# Patient Record
Sex: Male | Born: 1985
Health system: Southern US, Community
[De-identification: ages and names within clinical notes are randomized; demographics above are authoritative.]

## PROBLEM LIST (undated history)

## (undated) DIAGNOSIS — E119 Type 2 diabetes mellitus without complications: Secondary | ICD-10-CM

## (undated) DIAGNOSIS — I10 Essential (primary) hypertension: Secondary | ICD-10-CM

---

## 2016-07-08 ENCOUNTER — Encounter (HOSPITAL_COMMUNITY): Payer: Self-pay

## 2016-07-08 ENCOUNTER — Emergency Department (HOSPITAL_COMMUNITY)
Admission: EM | Admit: 2016-07-08 | Discharge: 2016-07-08 | Disposition: A | Discharge status: Home / Self Care | Payer: No Typology Code available for payment source | Attending: Emergency Medicine | Admitting: Emergency Medicine

## 2016-07-08 ENCOUNTER — Emergency Department (HOSPITAL_COMMUNITY): Payer: No Typology Code available for payment source

## 2016-07-08 DIAGNOSIS — I10 Essential (primary) hypertension: Secondary | ICD-10-CM | POA: Insufficient documentation

## 2016-07-08 DIAGNOSIS — Y939 Activity, unspecified: Secondary | ICD-10-CM | POA: Insufficient documentation

## 2016-07-08 DIAGNOSIS — S3992XA Unspecified injury of lower back, initial encounter: Secondary | ICD-10-CM | POA: Diagnosis present

## 2016-07-08 DIAGNOSIS — Y999 Unspecified external cause status: Secondary | ICD-10-CM | POA: Insufficient documentation

## 2016-07-08 DIAGNOSIS — E119 Type 2 diabetes mellitus without complications: Secondary | ICD-10-CM | POA: Diagnosis not present

## 2016-07-08 DIAGNOSIS — Y92410 Unspecified street and highway as the place of occurrence of the external cause: Secondary | ICD-10-CM | POA: Insufficient documentation

## 2016-07-08 DIAGNOSIS — S20219A Contusion of unspecified front wall of thorax, initial encounter: Secondary | ICD-10-CM | POA: Diagnosis not present

## 2016-07-08 DIAGNOSIS — S39012A Strain of muscle, fascia and tendon of lower back, initial encounter: Secondary | ICD-10-CM

## 2016-07-08 DIAGNOSIS — F17210 Nicotine dependence, cigarettes, uncomplicated: Secondary | ICD-10-CM | POA: Diagnosis not present

## 2016-07-08 HISTORY — DX: Type 2 diabetes mellitus without complications: E11.9

## 2016-07-08 HISTORY — DX: Essential (primary) hypertension: I10

## 2016-07-08 MED ORDER — IBUPROFEN 600 MG PO TABS: 600.0000 mg | ORAL_TABLET | Freq: Four times a day (QID) | ORAL | 0 refills | Status: DC | PRN

## 2016-07-08 MED ORDER — IBUPROFEN 800 MG PO TABS
800.0000 mg | ORAL_TABLET | Freq: Once | ORAL | Status: AC
  Administered 2016-07-08: 800 mg via ORAL
  Filled 2016-07-08: qty 1

## 2016-07-08 MED ORDER — CYCLOBENZAPRINE HCL 10 MG PO TABS: 10.0000 mg | ORAL_TABLET | Freq: Two times a day (BID) | ORAL | 0 refills | Status: DC | PRN
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
## 2016-07-08 NOTE — ED Notes (Signed)
Patient transported to X-ray 
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
## 2016-07-11 ENCOUNTER — Encounter (HOSPITAL_COMMUNITY): Payer: Self-pay | Admitting: Emergency Medicine

## 2016-07-11 ENCOUNTER — Emergency Department (HOSPITAL_COMMUNITY)
Admission: EM | Admit: 2016-07-11 | Discharge: 2016-07-11 | Disposition: A | Discharge status: Home / Self Care | Payer: No Typology Code available for payment source | Attending: Emergency Medicine | Admitting: Emergency Medicine

## 2016-07-11 DIAGNOSIS — F17210 Nicotine dependence, cigarettes, uncomplicated: Secondary | ICD-10-CM | POA: Insufficient documentation

## 2016-07-11 DIAGNOSIS — M545 Low back pain, unspecified: Secondary | ICD-10-CM

## 2016-07-11 DIAGNOSIS — Z79899 Other long term (current) drug therapy: Secondary | ICD-10-CM | POA: Diagnosis not present

## 2016-07-11 DIAGNOSIS — Y92410 Unspecified street and highway as the place of occurrence of the external cause: Secondary | ICD-10-CM | POA: Insufficient documentation

## 2016-07-11 DIAGNOSIS — Y999 Unspecified external cause status: Secondary | ICD-10-CM | POA: Insufficient documentation

## 2016-07-11 DIAGNOSIS — E119 Type 2 diabetes mellitus without complications: Secondary | ICD-10-CM | POA: Insufficient documentation

## 2016-07-11 DIAGNOSIS — I10 Essential (primary) hypertension: Secondary | ICD-10-CM | POA: Insufficient documentation

## 2016-07-11 DIAGNOSIS — Y939 Activity, unspecified: Secondary | ICD-10-CM | POA: Insufficient documentation

## 2016-07-11 MED ORDER — ACETAMINOPHEN 500 MG PO TABS: 1000.0000 mg | ORAL_TABLET | Freq: Four times a day (QID) | ORAL | 0 refills | Status: DC | PRN

## 2016-07-11 MED ORDER — ACETAMINOPHEN 500 MG PO TABS
1000.0000 mg | ORAL_TABLET | Freq: Once | ORAL | Status: AC
  Administered 2016-07-11: 1000 mg via ORAL
  Filled 2016-07-11: qty 2
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
## 2016-07-11 NOTE — Discharge Instructions (Signed)
Continue taking Ibuprofen and Flexeril.  You may take 1000 mg Tylenol every 6 hours.  Do not exceed 4 grams in 24 hours as this can cause liver damage.  You may also apply heat to help with the muscle spasms.  Try the attached exercises to help with lower back pain.  [facility].
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
## 2017-11-26 ENCOUNTER — Encounter (HOSPITAL_COMMUNITY): Payer: Self-pay | Admitting: Emergency Medicine

## 2017-11-26 ENCOUNTER — Emergency Department (HOSPITAL_COMMUNITY)
Admission: EM | Admit: 2017-11-26 | Discharge: 2017-11-26 | Disposition: A | Discharge status: Home / Self Care | Payer: BLUE CROSS/BLUE SHIELD | Attending: Emergency Medicine | Admitting: Emergency Medicine

## 2017-11-26 DIAGNOSIS — E119 Type 2 diabetes mellitus without complications: Secondary | ICD-10-CM | POA: Diagnosis not present

## 2017-11-26 DIAGNOSIS — I10 Essential (primary) hypertension: Secondary | ICD-10-CM | POA: Insufficient documentation

## 2017-11-26 DIAGNOSIS — F17210 Nicotine dependence, cigarettes, uncomplicated: Secondary | ICD-10-CM | POA: Diagnosis not present

## 2017-11-26 DIAGNOSIS — Z711 Person with feared health complaint in whom no diagnosis is made: Secondary | ICD-10-CM

## 2017-11-26 DIAGNOSIS — Z202 Contact with and (suspected) exposure to infections with a predominantly sexual mode of transmission: Secondary | ICD-10-CM | POA: Diagnosis present

## 2017-11-26 MED ORDER — CEFTRIAXONE SODIUM 250 MG IJ SOLR
250.0000 mg | Freq: Once | INTRAMUSCULAR | Status: AC
  Administered 2017-11-26: 250 mg via INTRAMUSCULAR
  Filled 2017-11-26: qty 250

## 2017-11-26 MED ORDER — LIDOCAINE HCL 1 % IJ SOLN
INTRAMUSCULAR | Status: AC
  Administered 2017-11-26: 0.9 mL
  Filled 2017-11-26: qty 20

## 2017-11-26 MED ORDER — AZITHROMYCIN 250 MG PO TABS
1000.0000 mg | ORAL_TABLET | Freq: Once | ORAL | Status: AC
  Administered 2017-11-26: 1000 mg via ORAL
  Filled 2017-11-26: qty 4

## 2017-11-26 NOTE — ED Notes (Signed)
Bed: WTR7 Expected date:  Expected time:  Means of arrival:  Comments: 

## 2017-11-26 NOTE — ED Triage Notes (Signed)
Patient reports that he got tested for STDs and GC chlamydia was inconclusive and his male partner got tested and she was positive for chlamydia, so he needs to be treated.

## 2017-11-26 NOTE — ED Notes (Signed)
Bed: [facility]1 Expected date:  Expected time:  Means of arrival:  Comments: 

## 2017-11-26 NOTE — ED Notes (Signed)
Bed: WTR9 Expected date:  Expected time:  Means of arrival:  Comments: 

## 2017-11-26 NOTE — ED Provider Notes (Signed)
[facility]-EMERGENCY DEPT Provider Note   CSN: [id number] Arrival date & time: 11/26/17  1113     History   Chief Complaint Chief Complaint  Patient presents with  . Exposure to STD    HPI Jaime Henson is a 32 y.o. male with a PMHx of DM2 and HTN, who presents to the ED for treatment of STDs.  Patient states that he was tested at the health department last week and his tests were "inconclusive" however his girlfriend tested positive for gonorrhea.  He is requesting treatment today.  He denies any testicular pain or swelling, penile discharge, dysuria, hematuria, abdominal pain, or any other complaints at this time.  No known aggravating or alleviating factors for his issue today. NKDA.    The history is provided by the patient and medical records. No language interpreter was used.  Exposure to STD  This is a new problem. The current episode started more than 1 week ago. The problem occurs constantly. The problem has not changed since onset.Pertinent negatives include no abdominal pain. Nothing aggravates the symptoms. Nothing relieves the symptoms. He has tried nothing for the symptoms. The treatment provided no relief.    Past Medical History:  Diagnosis Date  . Diabetes mellitus without complication (HCC)   . Hypertension   . Type 2 diabetes mellitus (HCC)     There are no active problems to display for this patient.   History reviewed. No pertinent surgical history.     Home Medications    Prior to Admission medications   Medication Sig Start Date End Date Taking? Authorizing Provider  acetaminophen (TYLENOL) 500 MG tablet Take 2 tablets (1,000 mg total) by mouth every 6 (six) hours as needed. 07/11/16   Cheri Fowler, PA-C  cyclobenzaprine (FLEXERIL) 10 MG tablet Take 1 tablet (10 mg total) by mouth 2 (two) times daily as needed for muscle spasms. 07/08/16   Rolan Bucco, MD  ibuprofen (ADVIL,MOTRIN) 600 MG tablet Take 1 tablet (600 mg total) by  mouth every 6 (six) hours as needed. 07/08/16   Rolan Bucco, MD    Family History No family history on file.  Social History Social History   Tobacco Use  . Smoking status: Current Some Day Smoker    Types: Cigarettes  . Smokeless tobacco: Never Used  Substance Use Topics  . Alcohol use: Yes  . Drug use: No     Allergies   Shellfish allergy   Review of Systems Review of Systems  Gastrointestinal: Negative for abdominal pain.  Genitourinary: Negative for discharge, dysuria, hematuria, scrotal swelling and testicular pain.  Allergic/Immunologic: Positive for immunocompromised state (DM2).     Physical Exam Updated Vital Signs BP (!) 151/110   Pulse 73   Temp 98.5 F (36.9 C) (Oral)   Resp 15   SpO2 100%   Physical Exam  Constitutional: He is oriented to person, place, and time. Vital signs are normal. He appears well-developed and well-nourished.  Non-toxic appearance. No distress.  Afebrile, nontoxic, NAD  HENT:  Head: Normocephalic and atraumatic.  Mouth/Throat: Oropharynx is clear and moist and mucous membranes are normal.  Eyes: Conjunctivae and EOM are normal. Right eye exhibits no discharge. Left eye exhibits no discharge.  Neck: Normal range of motion. Neck supple.  Cardiovascular: Normal rate, regular rhythm, normal heart sounds and intact distal pulses. Exam reveals no gallop and no friction rub.  No murmur heard. Pulmonary/Chest: Effort normal and breath sounds normal. No respiratory distress. He has no decreased breath  sounds. He has no wheezes. He has no rhonchi. He has no rales.  Abdominal: Soft. Normal appearance and bowel sounds are normal. He exhibits no distension. There is no tenderness. There is no rigidity, no rebound, no guarding, no CVA tenderness, no tenderness at McBurney's point and negative Murphy's sign.  Soft, NTND, +BS throughout, no r/g/r, neg murphy's, neg mcburney's, no CVA TTP   Genitourinary:  Genitourinary Comments: Deferred    Musculoskeletal: Normal range of motion.  Neurological: He is alert and oriented to person, place, and time. He has normal strength. No sensory deficit.  Skin: Skin is warm, dry and intact. No rash noted.  Psychiatric: He has a normal mood and affect.  Nursing note and vitals reviewed.    ED Treatments / Results  Labs (all labs ordered are listed, but only abnormal results are displayed) Labs Reviewed - No data to display  EKG  EKG Interpretation None       Radiology No results found.  Procedures Procedures (including critical care time)  Medications Ordered in ED Medications  azithromycin (ZITHROMAX) tablet 1,000 mg (not administered)    And  cefTRIAXone (ROCEPHIN) injection 250 mg (not administered)     Initial Impression / Assessment and Plan / ED Course  I have reviewed the triage vital signs and the nursing notes.  Pertinent labs & imaging results that were available during my care of the patient were reviewed by me and considered in my medical decision making (see chart for details).     32 y.o. male here requesting treatment for gonorrhea. He was tested and his test was "inconclusive" and his girlfriend tested positive so he wants treatment. Pt with no symptoms. No abdominal tenderness on exam. Deferred GU exam given lack of symptoms. Will treat for GC/CT empirically. Discussed abstinence x10 days. F/up with health dept for future STD concerns. Safe sex encouraged, and discussed having partners tested and treated before re-engaging in intercourse after the 10 day abstinence period.  I explained the diagnosis and have given explicit precautions to return to the ER including for any other new or worsening symptoms. The patient understands and accepts the medical plan as it's been dictated and I have answered their questions. Discharge instructions concerning home care and prescriptions have been given. The patient is STABLE and is discharged to home in good condition.     Final Clinical Impressions(s) / ED Diagnoses   Final diagnoses:  Concern about STD in male without diagnosis  Exposure to STD    ED Discharge Orders    [address]None       Brant Peets, PlatterMercedes, New JerseyPA-C 11/26/17 1322    Zadie RhineWickline, [place], MD 11/26/17 1558

## 2017-11-26 NOTE — Discharge Instructions (Signed)
You have been treated for gonorrhea and chlamydia in the ER but the hospital. NO SEXUAL INTERCOURSE FOR AT LEAST 10 DAYS AFTER TODAY'S VISIT, THIS WILL INVALIDATE YOUR TREATMENT HERE. ALL PARTNERS MUST BE TESTED AND TREATED FOR STD'S. ALWAYS USE CONDOMS WHEN ENGAGING IN INTERCOURSE. [facility]D concerns or screenings. This is the recommendation by the CDC for people with multiple sexual partners or history of STDs.

## 2017-12-28 ENCOUNTER — Emergency Department (HOSPITAL_COMMUNITY)
Admission: EM | Admit: 2017-12-28 | Discharge: 2017-12-28 | Disposition: A | Discharge status: Home / Self Care | Payer: BLUE CROSS/BLUE SHIELD | Attending: Physician Assistant | Admitting: Physician Assistant

## 2017-12-28 ENCOUNTER — Encounter (HOSPITAL_COMMUNITY): Payer: Self-pay | Admitting: Emergency Medicine

## 2017-12-28 ENCOUNTER — Emergency Department (HOSPITAL_COMMUNITY): Payer: BLUE CROSS/BLUE SHIELD

## 2017-12-28 ENCOUNTER — Other Ambulatory Visit: Payer: Self-pay

## 2017-12-28 DIAGNOSIS — E119 Type 2 diabetes mellitus without complications: Secondary | ICD-10-CM | POA: Diagnosis not present

## 2017-12-28 DIAGNOSIS — I10 Essential (primary) hypertension: Secondary | ICD-10-CM | POA: Diagnosis not present

## 2017-12-28 DIAGNOSIS — M25511 Pain in right shoulder: Secondary | ICD-10-CM

## 2017-12-28 DIAGNOSIS — F17210 Nicotine dependence, cigarettes, uncomplicated: Secondary | ICD-10-CM | POA: Diagnosis not present

## 2017-12-28 MED ORDER — MELOXICAM 15 MG PO TABS: 15.0000 mg | ORAL_TABLET | Freq: Every day | ORAL | 0 refills | Status: AC

## 2017-12-28 NOTE — Discharge Instructions (Addendum)
It was my pleasure taking care of you today!  Please take Mobic once daily for 7 days.  Ice shoulder throughout the day (instructions below).  Wear shoulder sling for no more than 3 days, then begin performing gentle range of motion exercises.   Call the orthopedist listed today or tomorrow to schedule a follow up appointment for recheck of ongoing shoulder pain in 1-2 weeks that can be canceled with a 24-48 hour notice if complete resolution of pain.    Call the orthopedist listed if symptoms are not improved in one week.   Return to the ER for new or worsening symptoms, any additional concerns.  COLD THERAPY DIRECTIONS:  Ice or gel packs can be used to reduce both pain and swelling. Ice is the most helpful within the first 24 to 48 hours after an injury or flareup from overusing a muscle or joint.  Ice is effective, has very few side effects, and is safe for most people to use.   If you expose your skin to cold temperatures for too long or without the proper protection, you can damage your skin or nerves. Watch for signs of skin damage due to cold.   HOME CARE INSTRUCTIONS  Follow these tips to use ice and cold packs safely.  Place a dry or damp towel between the ice and skin. A damp towel will cool the skin more quickly, so you may need to shorten the time that the ice is used.  For a more rapid response, add gentle compression to the ice.  Ice for no more than 10 to 20 minutes at a time. The bonier the area you are icing, the less time it will take to get the benefits of ice.  Check your skin after 5 minutes to make sure there are no signs of a poor response to cold or skin damage.  Rest 20 minutes or more in between uses.  Once your skin is numb, you can end your treatment. You can test numbness by very lightly touching your skin. The touch should be so light that you do not see the skin dimple from the pressure of your fingertip. When using ice, most people will feel these normal  sensations in this order: cold, burning, aching, and numbness.    Please return the emergency department if you notice any redness, increased swelling, loss of sensation, weakness, or abnormal color to right upper extremity.  To find a primary care provider: To find a primary care or specialty doctor please call [phone] or [phone] to access "[facility] Find a Doctor Service."  You may also go on the [facility] website at [email or web address]  There are also multiple [facility] and [facility] practices throughout the [place] that are frequently accepting new patients. You may find a clinic that is close to your home and contact them.  [facility] - [address] [place] [phone]  [facility] in [place] (also locations in [place] and [place]) - [address] [place] [phone]  [facility] - [address] [place] [phone]

## 2017-12-28 NOTE — ED Provider Notes (Signed)
[facility]-EMERGENCY DEPT Provider Note   CSN: [id number] Arrival date & time: 12/28/17  1209     History   Chief Complaint Chief Complaint  Patient presents with  . Shoulder Pain    HPI Jaime Henson is a 32 y.o. male.  HPI   Patient is a 32 year old male with a history of diabetes mellitus, type II (previously on Metformin) and hypertension presenting for right shoulder pain.  Patient reports that it began approximately 2 weeks ago, and he believes he began gradually.  Patient does not recall specific inciting incident.  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  Patient does report that he does a lot of repetitive motion in his job.  Patient denies any erythema, edema to the right shoulder.  Patient denies any fever or chills.  Patient denies any weakness, numbness, pallor, paresthesias, or swelling of the right upper extremity.  Patient denies neck pain.  Patient reports trying BC powder and naproxen for his symptoms.  Patient does not have a primary care provider present.  Past Medical History:  Diagnosis Date  . Diabetes mellitus without complication (HCC)   . Hypertension   . Type 2 diabetes mellitus (HCC)     There are no active problems to display for this patient.   History reviewed. No pertinent surgical history.      Home Medications    Prior to Admission medications   Medication Sig Start Date End Date Taking? Authorizing Provider  acetaminophen (TYLENOL) 500 MG tablet Take 2 tablets (1,000 mg total) by mouth every 6 (six) hours as needed. 07/11/16   Cheri Fowler, PA-C  cyclobenzaprine (FLEXERIL) 10 MG tablet Take 1 tablet (10 mg total) by mouth 2 (two) times daily as needed for muscle spasms. 07/08/16   Rolan Bucco, MD  ibuprofen (ADVIL,MOTRIN) 600 MG tablet Take 1 tablet (600 mg total) by mouth every 6 (six) hours as needed. 07/08/16   Rolan Bucco, MD     Family History No family history on file.  Social History Social History   Tobacco Use  . Smoking status: Current Some Day Smoker    Types: Cigarettes  . Smokeless tobacco: Never Used  Substance Use Topics  . Alcohol use: Yes  . Drug use: No     Allergies   Shellfish allergy   Review of Systems Review of Systems  Constitutional: Negative for chills and fever.  Musculoskeletal: Positive for arthralgias and myalgias. Negative for joint swelling and neck pain.  Neurological: Negative for weakness and numbness.     Physical Exam Updated Vital Signs BP (!) 146/86 (BP Location: Left Arm)   Pulse 82   Temp 98.2 F (36.8 C) (Oral)   Resp 16   SpO2 98%   Physical Exam  Constitutional: He appears well-developed and well-nourished. No distress.  Sitting comfortably in bed.  HENT:  Head: Normocephalic and atraumatic.  Eyes: Conjunctivae are normal. Right eye exhibits no discharge. Left eye exhibits no discharge.  EOMs normal to gross examination.  Neck: Normal range of motion.  Cardiovascular: Normal rate and regular rhythm.  Intact, 2+ radial pulse of RUE.  Pulmonary/Chest:  Normal respiratory effort. Patient converses comfortably. No audible wheeze or stridor.  Abdominal: He exhibits no distension.  Musculoskeletal: Normal range of motion.       Arms: Right shoulder with tenderness to palpation over [facility] joint as depicted in image. Full ROM but  with pain. Negative empty can test, negative Neer's, + Lift off. No swelling, erythema or ecchymosis present. No step-off, crepitus, or deformity appreciated. 5/5 muscle strength of RUE. 2+ radial pulse, sensation intact and all compartments soft.  Neurological: He is alert.  Cranial nerves intact to gross observation. Patient moves extremities without difficulty.  Skin: Skin is warm and dry. He is not diaphoretic.  Psychiatric: He has a normal mood and affect. His behavior is normal. Judgment and thought content normal.   Nursing note and vitals reviewed.    ED Treatments / Results  Labs (all labs ordered are listed, but only abnormal results are displayed) Labs Reviewed - No data to display  EKG None  Radiology Dg Shoulder Right  Result Date: 12/28/2017 CLINICAL DATA:  Pain after heavy lifting EXAM: RIGHT SHOULDER - 2+ VIEW COMPARISON:  None. FINDINGS: Oblique, Y scapular, and axillary images were obtained. No fracture or dislocation. Joint spaces appear normal. No erosive change or intra-articular calcification. Visualized right lung clear. IMPRESSION: No fracture or dislocation.  No appreciable arthropathy. Electronically Signed   By: Bretta BangWilliam  Woodruff III M.D.   On: 12/28/2017 13:51    Procedures Procedures (including critical care time)  Medications Ordered in ED Medications - No data to display   Initial Impression / Assessment and Plan / ED Course  I have reviewed the triage vital signs and the nursing notes.  Pertinent labs & imaging results that were available during my care of the patient were reviewed by me and considered in my medical decision making (see chart for details).     Patient is nontoxic-appearing and in no acute distress.  Patient with repetitive motion job, and acute on chronic pain.  Suspect tendinitis versus bursitis of the right shoulder.  Not clearly rotator cuff tendinitis given the distribution exam.  No evidence of septic arthritis, or neurologic deficits concerning for cervical pathology.  Normal lung sounds and no shortness of breath to suggest pulmonary pathology of right shoulder pain.  Imaging is negative for acute bony abnormality.  Will treat with NSAIDs, topical therapy. Patient to follow-up with primary care and orthopedics.  Patient is in understanding and agrees with plan of care.  Of note, blood pressure is elevated today.  Patient is aware of this, and is looking into finding a primary care provider.  Final Clinical Impressions(s) / ED Diagnoses    Final diagnoses:  Acute pain of right shoulder    ED Discharge Orders        Ordered    meloxicam (MOBIC) 15 MG tablet  Daily     12/28/17 1441       Elisha PonderMurray, Bina Veenstra B, PA-C 12/28/17 1444    Mackuen, Cindee Saltourteney Lyn, MD 12/28/17 1559

## 2017-12-28 NOTE — ED Triage Notes (Signed)
Pt complaint of right shoulder pain post lifting something at work heavy at 0730.

## 2018-01-02 ENCOUNTER — Emergency Department (HOSPITAL_COMMUNITY)
Admission: EM | Admit: 2018-01-02 | Discharge: 2018-01-02 | Disposition: A | Discharge status: Home / Self Care | Payer: BLUE CROSS/BLUE SHIELD | Attending: Emergency Medicine | Admitting: Emergency Medicine

## 2018-01-02 ENCOUNTER — Encounter (HOSPITAL_COMMUNITY): Payer: Self-pay | Admitting: Emergency Medicine

## 2018-01-02 DIAGNOSIS — I10 Essential (primary) hypertension: Secondary | ICD-10-CM | POA: Insufficient documentation

## 2018-01-02 DIAGNOSIS — E119 Type 2 diabetes mellitus without complications: Secondary | ICD-10-CM | POA: Insufficient documentation

## 2018-01-02 DIAGNOSIS — Y939 Activity, unspecified: Secondary | ICD-10-CM | POA: Diagnosis not present

## 2018-01-02 DIAGNOSIS — F17210 Nicotine dependence, cigarettes, uncomplicated: Secondary | ICD-10-CM | POA: Insufficient documentation

## 2018-01-02 DIAGNOSIS — W260XXA Contact with knife, initial encounter: Secondary | ICD-10-CM | POA: Insufficient documentation

## 2018-01-02 DIAGNOSIS — Y999 Unspecified external cause status: Secondary | ICD-10-CM | POA: Diagnosis not present

## 2018-01-02 DIAGNOSIS — Z23 Encounter for immunization: Secondary | ICD-10-CM | POA: Diagnosis not present

## 2018-01-02 DIAGNOSIS — S61217A Laceration without foreign body of left little finger without damage to nail, initial encounter: Secondary | ICD-10-CM | POA: Insufficient documentation

## 2018-01-02 DIAGNOSIS — Z79899 Other long term (current) drug therapy: Secondary | ICD-10-CM | POA: Insufficient documentation

## 2018-01-02 DIAGNOSIS — Y929 Unspecified place or not applicable: Secondary | ICD-10-CM | POA: Insufficient documentation

## 2018-01-02 MED ORDER — TETANUS-DIPHTH-ACELL PERTUSSIS 5-2.5-18.5 LF-MCG/0.5 IM SUSP
0.5000 mL | Freq: Once | INTRAMUSCULAR | Status: AC
  Administered 2018-01-02: 0.5 mL via INTRAMUSCULAR
  Filled 2018-01-02: qty 0.5

## 2018-01-02 MED ORDER — LIDOCAINE HCL 2 % IJ SOLN
10.0000 mL | Freq: Once | INTRAMUSCULAR | Status: AC
  Administered 2018-01-02: 200 mg via INTRADERMAL
  Filled 2018-01-02: qty 20

## 2018-01-02 NOTE — ED Triage Notes (Signed)
Patient reports cutting hand with knife at work today. Reports he applied liquid bandage PTA. Approx half inch laceration to left fifth knuckle. Bleeding controlled. Reports last tetanus shot within five years.

## 2018-01-02 NOTE — Discharge Instructions (Addendum)
Have stitches removed in 7 days.  As we discussed wash with warm soapy water daily.  Look out for any signs of infection including redness or swelling or fever.  If this happens please return to the emergency department.  Your blood pressure was elevated today, please have this rechecked by her regular doctor.

## 2018-01-02 NOTE — ED Provider Notes (Addendum)
[facility]-EMER[facility]PT Provider Note   CSN: [id number] Arrival date & time: 01/02/18  1221     History   Chief Complaint Chief Complaint  Patient presents with  . Laceration    HPI Jaime BaptiseJeremy Henson is a 32 y.o. male.  HPI  Jaime Henson is a 32yo male with a history of type 2 diabetes, hypertension who presents to the emergency department for evaluation of laceration on left hand.  Patient reports that he accidentally slipped his hand on a knife earlier today around 1030 this morning.  States that he had persistent bleeding from the wound even with applying pressure.  He placed a liquid bandage over the skin and had subsequent improvement in bleeding.  Denies any pain over the laceration.  Denies fevers, chills, numbness, weakness.  He is unsure of his last Tdap vaccine.  Past Medical History:  Diagnosis Date  . Diabetes mellitus without complication (HCC)   . Hypertension   . Type 2 diabetes mellitus (HCC)     There are no active problems to display for this patient.   History reviewed. No pertinent surgical history.      Home Medications    Prior to Admission medications   Medication Sig Start Date End Date Taking? Authorizing Provider  acetaminophen (TYLENOL) 500 MG tablet Take 2 tablets (1,000 mg total) by mouth every 6 (six) hours as needed. 07/11/16   Cheri Fowlerose, Kayla, PA-C  cyclobenzaprine (FLEXERIL) 10 MG tablet Take 1 tablet (10 mg total) by mouth 2 (two) times daily as needed for muscle spasms. 07/08/16   Rolan BuccoBelfi, Melanie, MD  ibuprofen (ADVIL,MOTRIN) 600 MG tablet Take 1 tablet (600 mg total) by mouth every 6 (six) hours as needed. 07/08/16   Rolan BuccoBelfi, Melanie, MD  meloxicam (MOBIC) 15 MG tablet Take 1 tablet (15 mg total) by mouth daily for 7 days. 12/28/17 01/04/18  Elisha PonderMurray, Alyssa B, PA-C    Family History No family history on file.  Social History Social History   Tobacco Use  . Smoking status: Current Some Day Smoker    Types: Cigarettes  .  Smokeless tobacco: Never Used  Substance Use Topics  . Alcohol use: Yes  . Drug use: No     Allergies   Shellfish allergy   Review of Systems Review of Systems  Constitutional: Negative for chills and fever.  Musculoskeletal: Negative for arthralgias and joint swelling.  Skin: Positive for wound (1cm laceration over fifth knuckle of left hand).  Neurological: Negative for weakness and numbness.     Physical Exam Updated Vital Signs BP (!) 158/105 (BP Location: Left Arm) Comment: pt reports hx of HTN, has not taken meds for years.   Pulse 72   Temp 98.5 F (36.9 C) (Oral)   Resp 14   Ht 5\' 10"  (1.778 m)   Wt 99.8 kg (220 lb)   SpO2 100%   BMI 31.57 kg/m   Physical Exam  Constitutional: He is oriented to person, place, and time. He appears well-developed and well-nourished. No distress.  HENT:  Head: Normocephalic and atraumatic.  Eyes: Right eye exhibits no discharge. Left eye exhibits no discharge.  Pulmonary/Chest: Effort normal. No respiratory distress.  Musculoskeletal:       Hands: Approximately 1cm horizontal laceration noted over the left fifth knuckle. No surrounding erythema or warmth. No tenderness.  Full active range of motion of the left little finger without pain.  Grip strength 5/5.  Cap refill <2sec. Distal sensation to light touch intact beyond laceration  Neurological: He is alert and oriented to person, place, and time. Coordination normal.  Skin: Skin is warm and dry. Capillary refill takes less than 2 seconds. He is not diaphoretic.  Psychiatric: He has a normal mood and affect. His behavior is normal.  Nursing note and vitals reviewed.    ED Treatments / Results  Labs (all labs ordered are listed, but only abnormal results are displayed) Labs Reviewed - No data to display  EKG None  Radiology No results found.  Procedures .Marland KitchenLaceration Repair Date/Time: 01/02/2018 5:07 PM Performed by: Kellie Shropshire, PA-C Authorized by: Kellie Shropshire, PA-C   Consent:    Consent obtained:  Verbal and emergent situation   Consent given by:  Patient   Risks discussed:  Infection, pain, poor cosmetic result, poor wound healing and retained foreign body   Alternatives discussed:  No treatment and delayed treatment Anesthesia (see MAR for exact dosages):    Anesthesia method:  Local infiltration   Local anesthetic:  Lidocaine 2% w/o epi Laceration details:    Location:  Finger   Finger location:  L small finger   Length (cm):  1   Depth (mm):  5 Repair type:    Repair type:  Simple Pre-procedure details:    Preparation:  Patient was prepped and draped in usual sterile fashion Exploration:    Hemostasis achieved with:  Direct pressure   Wound exploration: wound explored through full range of motion and entire depth of wound probed and visualized     Contaminated: no   Treatment:    Area cleansed with:  Betadine   Amount of cleaning:  Extensive   Irrigation solution:  Sterile saline   Irrigation volume:  200cc   Irrigation method:  Pressure wash Skin repair:    Repair method:  Sutures   Suture size:  5-0   Suture material:  Prolene   Suture technique:  Simple interrupted   Number of sutures:  3 Approximation:    Approximation:  Close Post-procedure details:    Dressing:  Open (no dressing)   Patient tolerance of procedure:  Tolerated well, no immediate complications   (including critical care time)  Medications Ordered in ED Medications  lidocaine (XYLOCAINE) 2 % (with pres) injection 200 mg (200 mg Intradermal Given 01/02/18 1555)  Tdap (BOOSTRIX) injection 0.5 mL (0.5 mLs Intramuscular Given 01/02/18 1612)     Initial Impression / Assessment and Plan / ED Course  I have reviewed the triage vital signs and the nursing notes.  Pertinent labs & imaging results that were available during my care of the patient were reviewed by me and considered in my medical decision making (see chart for details).     Presents  with laceration over left fifth knuckle from knife. Patient is afebrile, no surrounding erythema or warmth. No concern for superimposed infection. Hand is nontender with full ROM of the finger without pain, no concern for acute fracture.   Pressure irrigation performed. Wound explored and base of wound visualized in a bloodless field without evidence of foreign body.  Laceration occurred < 8 hours prior to repair which was well tolerated. Tdap updated. No signs of contamination. Pt discharged without antibiotics.  Discussed suture home care with patient and answered questions. Pt to follow-up for wound check and suture removal in 7 days; they are to return to the ED sooner for signs of infection.  His blood pressure was elevated in the emergency department today, counseled him to have this rechecked by  his primary care doctor.  Pt is hemodynamically stable with no complaints prior to dc.  He agrees and voiced understanding to the above plan.   Final Clinical Impressions(s) / ED Diagnoses   Final diagnoses:  Laceration of left little finger without foreign body without damage to nail, initial encounter    ED Discharge Orders    None       Kellie Shropshire, PA-C 01/02/18 1641    Kellie Shropshire, PA-C 01/02/18 1708    Wynetta Fines, MD 01/02/18 [phone]

## 2018-01-09 ENCOUNTER — Other Ambulatory Visit: Payer: Self-pay

## 2018-01-09 ENCOUNTER — Encounter (HOSPITAL_COMMUNITY): Payer: Self-pay | Admitting: *Deleted

## 2018-01-09 ENCOUNTER — Emergency Department (HOSPITAL_COMMUNITY)
Admission: EM | Admit: 2018-01-09 | Discharge: 2018-01-09 | Disposition: A | Discharge status: Home / Self Care | Payer: BLUE CROSS/BLUE SHIELD | Attending: Emergency Medicine | Admitting: Emergency Medicine

## 2018-01-09 DIAGNOSIS — Z4802 Encounter for removal of sutures: Secondary | ICD-10-CM | POA: Diagnosis present

## 2018-01-09 DIAGNOSIS — F17210 Nicotine dependence, cigarettes, uncomplicated: Secondary | ICD-10-CM | POA: Insufficient documentation

## 2018-01-09 DIAGNOSIS — E119 Type 2 diabetes mellitus without complications: Secondary | ICD-10-CM | POA: Insufficient documentation

## 2018-01-09 DIAGNOSIS — I10 Essential (primary) hypertension: Secondary | ICD-10-CM | POA: Insufficient documentation

## 2018-01-09 NOTE — ED Triage Notes (Signed)
Pt here for suture removal. Pt has 3 sutures in his left hand.

## 2018-01-09 NOTE — ED Provider Notes (Signed)
[facility]-EM[facility]PT Provider Note   CSN: [id number] Arrival date & time: 01/09/18  1207     History   Chief Complaint Chief Complaint  Patient presents with  . Suture / Staple Removal    HPI Jaime Henson is a 32 y.o. male.  Who presents the emergency department for suture removal.  Patient has no active complaints.  The sutures were placed on 01/02/2018.  HPI  Past Medical History:  Diagnosis Date  . Diabetes mellitus without complication (HCC)   . Hypertension   . Type 2 diabetes mellitus (HCC)     There are no active problems to display for this patient.   History reviewed. No pertinent surgical history.      Home Medications    Prior to Admission medications   Medication Sig Start Date End Date Taking? Authorizing Provider  acetaminophen (TYLENOL) 500 MG tablet Take 2 tablets (1,000 mg total) by mouth every 6 (six) hours as needed. 07/11/16   Cheri Fowler, PA-C  cyclobenzaprine (FLEXERIL) 10 MG tablet Take 1 tablet (10 mg total) by mouth 2 (two) times daily as needed for muscle spasms. 07/08/16   Rolan Bucco, MD  ibuprofen (ADVIL,MOTRIN) 600 MG tablet Take 1 tablet (600 mg total) by mouth every 6 (six) hours as needed. 07/08/16   Rolan Bucco, MD    Family History No family history on file.  Social History Social History   Tobacco Use  . Smoking status: Current Some Day Smoker    Types: Cigarettes  . Smokeless tobacco: Never Used  Substance Use Topics  . Alcohol use: Yes  . Drug use: No     Allergies   Shellfish allergy   Review of Systems Review of Systems  Constitutional: Negative for chills and fever.  Skin: Positive for wound.     Physical Exam Updated Vital Signs BP (!) 155/112 (BP Location: Left Arm)   Pulse 71   Temp 98.8 F (37.1 C) (Oral)   Resp 18   SpO2 100%   Physical Exam  Constitutional: He is oriented to person, place, and time. He appears well-developed and well-nourished. No distress.    HENT:  Head: Normocephalic and atraumatic.  Eyes: Conjunctivae are normal. No scleral icterus.  Neck: Normal range of motion. Neck supple.  Cardiovascular: Normal rate, regular rhythm and normal heart sounds.  Pulmonary/Chest: Effort normal and breath sounds normal. No respiratory distress.  Abdominal: Soft. There is no tenderness.  Musculoskeletal: Normal range of motion. He exhibits no edema.  Neurological: He is alert and oriented to person, place, and time.  Skin: Skin is warm and dry. He is not diaphoretic.  Psychiatric: His behavior is normal.  Nursing note and vitals reviewed.    ED Treatments / Results  Labs (all labs ordered are listed, but only abnormal results are displayed) Labs Reviewed - No data to display  EKG None  Radiology No results found.  Procedures Procedures (including critical care time)  SUTURE REMOVAL Performed by: Arthor Captain  Consent: Verbal consent obtained. Patient identity confirmed: provided demographic data Time out: Immediately prior to procedure a "time out" was called to verify the correct patient, procedure, equipment, support staff and site/side marked as required.  Location details: left hand    Wound Appearance: clean  Sutures/Staples Removed: 3  Facility: sutures placed in this facility Patient tolerance: Patient tolerated the procedure well with no immediate complications.     Medications Ordered in ED Medications - No data to display   Initial Impression /  Assessment and Plan / ED Course  I have reviewed the triage vital signs and the nursing notes.  Pertinent labs & imaging results that were available during my care of the patient were reviewed by me and considered in my medical decision making (see chart for details).     Staple removal   Pt to ER for staple/suture removal and wound check as above. Procedure tolerated well. Vitals normal, no signs of infection. Scar minimization & return precautions given  at dc.    Final Clinical Impressions(s) / ED Diagnoses   Final diagnoses:  Visit for suture removal    ED Discharge Orders    None       Arthor CaptainHarris, Nicki Gracy, PA-C 01/09/18 1331    Linwood DibblesKnapp, Jon, MD 01/10/18 [phone]

## 2018-01-09 NOTE — ED Notes (Signed)
Pt is alert and oriented x 4 and is verbally responsive. Pt denies pain this time. Pt is her for suture removal on top of  left hand.

## 2018-01-09 NOTE — Discharge Instructions (Addendum)
Contact a health care provider if: °You have increasing redness, swelling, or pain in the wound. °You see pus coming from the wound. °You have a fever. °You notice a bad smell coming from the wound or dressing. °Your wound breaks open (edges not staying together). °

## 2018-01-09 NOTE — ED Notes (Signed)
Bed: [facility] Expected date:  Expected time:  Means of arrival:  Comments: 

## 2018-03-14 ENCOUNTER — Emergency Department (HOSPITAL_COMMUNITY)
Admission: EM | Admit: 2018-03-14 | Discharge: 2018-03-14 | Disposition: A | Discharge status: Home / Self Care | Payer: BLUE CROSS/BLUE SHIELD | Attending: Emergency Medicine | Admitting: Emergency Medicine

## 2018-03-14 ENCOUNTER — Encounter (HOSPITAL_COMMUNITY): Payer: Self-pay | Admitting: *Deleted

## 2018-03-14 DIAGNOSIS — E119 Type 2 diabetes mellitus without complications: Secondary | ICD-10-CM | POA: Insufficient documentation

## 2018-03-14 DIAGNOSIS — R1110 Vomiting, unspecified: Secondary | ICD-10-CM | POA: Diagnosis present

## 2018-03-14 DIAGNOSIS — F17210 Nicotine dependence, cigarettes, uncomplicated: Secondary | ICD-10-CM | POA: Insufficient documentation

## 2018-03-14 DIAGNOSIS — I10 Essential (primary) hypertension: Secondary | ICD-10-CM | POA: Diagnosis not present

## 2018-03-14 DIAGNOSIS — R112 Nausea with vomiting, unspecified: Secondary | ICD-10-CM | POA: Diagnosis not present

## 2018-03-14 MED ORDER — ONDANSETRON HCL 4 MG/2ML IJ SOLN: 4.0000 mg | Freq: Once | INTRAMUSCULAR | Status: DC

## 2018-03-14 MED ORDER — ONDANSETRON 4 MG PO TBDP
4.0000 mg | ORAL_TABLET | Freq: Once | ORAL | Status: AC
  Administered 2018-03-14: 4 mg via ORAL
  Filled 2018-03-14: qty 1

## 2018-03-14 MED ORDER — LACTATED RINGERS IV BOLUS: 1000.0000 mL | Freq: Once | INTRAVENOUS | Status: DC

## 2018-03-14 NOTE — ED Triage Notes (Signed)
Pt complains of vomiting for the past 2 days. Pt denies abdominal pain or diarrhea. Pt denies hematemesis. Pt was able to keep orange juice and soda from this morning.

## 2018-03-19 NOTE — ED Provider Notes (Addendum)
  [facility]-EMERGENCY DEPT Provider Note   CSN: [id number] Arrival date & time: 03/14/18  0831     History   Chief Complaint Chief Complaint  Patient presents with  . Emesis    HPI Rolm BaptiseJeremy Tool is a 32 y.o. male.  HPI   32 year old male with vomiting.  Onset 2 days ago.  No abdominal pain.  No diarrhea.  No fevers or chills.  He was able to keep down a small amount of juice this morning.  No sick contacts.  Resting work note.  Past Medical History:  Diagnosis Date  . Diabetes mellitus without complication (HCC)   . Hypertension   . Type 2 diabetes mellitus (HCC)     There are no active problems to display for this patient.   History reviewed. No pertinent surgical history.      Home Medications    Prior to Admission medications   Not on File    Family History No family history on file.  Social History Social History   Tobacco Use  . Smoking status: Current Some Day Smoker    Types: Cigarettes  . Smokeless tobacco: Never Used  Substance Use Topics  . Alcohol use: Yes  . Drug use: No     Allergies   Shellfish allergy   Review of Systems Review of Systems  All systems reviewed and negative, other than as noted in HPI.  Physical Exam Updated Vital Signs BP (!) 146/89 (BP Location: Right Arm)   Pulse 85   Temp 98.1 F (36.7 C) (Oral)   Resp 19   SpO2 99%   Physical Exam  Constitutional: He appears well-developed and well-nourished. No distress.  HENT:  Head: Normocephalic and atraumatic.  Eyes: Conjunctivae are normal. Right eye exhibits no discharge. Left eye exhibits no discharge.  Neck: Neck supple.  Cardiovascular: Normal rate, regular rhythm and normal heart sounds. Exam reveals no gallop and no friction rub.  No murmur heard. Pulmonary/Chest: Effort normal and breath sounds normal. No respiratory distress.  Abdominal: Soft. He exhibits no distension. There is no tenderness.  Musculoskeletal: He exhibits no  edema or tenderness.  Neurological: He is alert.  Skin: Skin is warm and dry.  Psychiatric: He has a normal mood and affect. His behavior is normal. Thought content normal.  Nursing note and vitals reviewed.    ED Treatments / Results  Labs (all labs ordered are listed, but only abnormal results are displayed) Labs Reviewed - No data to display  EKG None  Radiology No results found.  Procedures Procedures (including critical care time)  Medications Ordered in ED Medications  ondansetron (ZOFRAN-ODT) disintegrating tablet 4 mg (4 mg Oral Given 03/14/18 1123)     Initial Impression / Assessment and Plan / ED Course  I have reviewed the triage vital signs and the nursing notes.  Pertinent labs & imaging results that were available during my care of the patient were reviewed by me and considered in my medical decision making (see chart for details).     likely viral gi illness. Keeping fluid down this morning. Benign abdominal exam. Afebrile. Nontoxic. I doubt emergent process.   Final Clinical Impressions(s) / ED Diagnoses   Final diagnoses:  Nausea and vomiting, intractability of vomiting not specified, unspecified vomiting type    ED Discharge Orders    None       Raeford RazorKohut, Jazmyn Offner, MD 03/19/18 1523    Raeford RazorKohut, Ceira Hoeschen, MD 03/19/18 1523

## 2018-06-15 ENCOUNTER — Emergency Department (HOSPITAL_COMMUNITY): Payer: BLUE CROSS/BLUE SHIELD

## 2018-06-15 ENCOUNTER — Encounter (HOSPITAL_COMMUNITY): Payer: Self-pay | Admitting: Emergency Medicine

## 2018-06-15 ENCOUNTER — Emergency Department (HOSPITAL_COMMUNITY)
Admission: EM | Admit: 2018-06-15 | Discharge: 2018-06-15 | Discharge status: Against Medical Advice | Payer: BLUE CROSS/BLUE SHIELD | Attending: Emergency Medicine | Admitting: Emergency Medicine

## 2018-06-15 DIAGNOSIS — R079 Chest pain, unspecified: Secondary | ICD-10-CM | POA: Insufficient documentation

## 2018-06-15 DIAGNOSIS — Z5321 Procedure and treatment not carried out due to patient leaving prior to being seen by health care provider: Secondary | ICD-10-CM | POA: Insufficient documentation

## 2018-06-15 LAB — CBC
HEMATOCRIT: 43.4 % (ref 39.0–52.0)
HEMOGLOBIN: 14.5 g/dL (ref 13.0–17.0)
MCH: 30.6 pg (ref 26.0–34.0)
MCHC: 33.4 g/dL (ref 30.0–36.0)
MCV: 91.6 fL (ref 78.0–100.0)
Platelets: 230 10*3/uL (ref 150–400)
RBC: 4.74 MIL/uL (ref 4.22–5.81)
RDW: 12.8 % (ref 11.5–15.5)
WBC: 7.1 10*3/uL (ref 4.0–10.5)

## 2018-06-15 LAB — BASIC METABOLIC PANEL
ANION GAP: 12 (ref 5–15)
BUN: 14 mg/dL (ref 6–20)
CHLORIDE: 104 mmol/L (ref 98–111)
CO2: 26 mmol/L (ref 22–32)
Calcium: 9.3 mg/dL (ref 8.9–10.3)
Creatinine, Ser: 1.2 mg/dL (ref 0.61–1.24)
GFR calc Af Amer: >60 mL/min (ref >60)
GLUCOSE: 180 mg/dL — AB (ref 70–99)
POTASSIUM: 3.9 mmol/L (ref 3.5–5.1)
Sodium: 142 mmol/L (ref 135–145)

## 2018-06-15 LAB — I-STAT TROPONIN, ED: Troponin i, poc: 0.02 ng/mL (ref 0.00–0.08)

## 2018-06-15 NOTE — ED Triage Notes (Signed)
Pt reports that he had right chest pains that started last night. Reports when he moves right arm pain feels better.

## 2018-06-15 NOTE — ED Notes (Signed)
Pt called in lobby to be roomed, x3 no response.

## 2018-06-15 NOTE — ED Notes (Signed)
Pt called for room, no response from lobby 

## 2018-07-12 ENCOUNTER — Other Ambulatory Visit: Payer: Self-pay

## 2018-07-12 ENCOUNTER — Encounter (HOSPITAL_COMMUNITY): Payer: Self-pay | Admitting: Emergency Medicine

## 2018-07-12 ENCOUNTER — Emergency Department (HOSPITAL_COMMUNITY)
Admission: EM | Admit: 2018-07-12 | Discharge: 2018-07-12 | Disposition: A | Discharge status: Home / Self Care | Payer: BLUE CROSS/BLUE SHIELD | Attending: Emergency Medicine | Admitting: Emergency Medicine

## 2018-07-12 DIAGNOSIS — F17210 Nicotine dependence, cigarettes, uncomplicated: Secondary | ICD-10-CM | POA: Diagnosis not present

## 2018-07-12 DIAGNOSIS — I10 Essential (primary) hypertension: Secondary | ICD-10-CM | POA: Diagnosis not present

## 2018-07-12 DIAGNOSIS — Z79899 Other long term (current) drug therapy: Secondary | ICD-10-CM | POA: Diagnosis not present

## 2018-07-12 DIAGNOSIS — L0201 Cutaneous abscess of face: Secondary | ICD-10-CM | POA: Insufficient documentation

## 2018-07-12 DIAGNOSIS — R220 Localized swelling, mass and lump, head: Secondary | ICD-10-CM | POA: Diagnosis present

## 2018-07-12 DIAGNOSIS — E119 Type 2 diabetes mellitus without complications: Secondary | ICD-10-CM | POA: Insufficient documentation

## 2018-07-12 DIAGNOSIS — L0291 Cutaneous abscess, unspecified: Secondary | ICD-10-CM

## 2018-07-12 MED ORDER — SULFAMETHOXAZOLE-TRIMETHOPRIM 800-160 MG PO TABS: 1.0000 | ORAL_TABLET | Freq: Two times a day (BID) | ORAL | 0 refills | Status: AC

## 2018-07-12 MED ORDER — SULFAMETHOXAZOLE-TRIMETHOPRIM 800-160 MG PO TABS
1.0000 | ORAL_TABLET | Freq: Once | ORAL | Status: AC
  Administered 2018-07-12: 1 via ORAL
  Filled 2018-07-12: qty 1

## 2018-07-12 NOTE — ED Provider Notes (Signed)
[facility]-EMERGENCY DEPT Provider Note   CSN: [id number] Arrival date & time: 07/12/18  4098     History   Chief Complaint Chief Complaint  Patient presents with  . Abscess    HPI Jaime Henson is a 32 y.o. male.  32 year old male with prior medical history as detailed below presents with complaint of swelling to the right cheek.  Patient reports tenderness and swelling to the right cheek.  This was noticed this morning per the patient.  He denies associated fever.  He denies dental pain.  He denies other injury.  The history is provided by the patient and medical records.  Abscess  Abscess location: Right cheek. Abscess quality: induration and painful   Abscess quality: not draining, no fluctuance, no itching, no redness, no warmth and not weeping   Red streaking: no   Progression:  Unchanged Pain details:    Quality:  Dull   Severity:  Mild   Duration:  6 hours   Timing:  Constant   Progression:  Unchanged Chronicity:  New Context: diabetes   Relieved by:  Nothing Worsened by:  Nothing   Past Medical History:  Diagnosis Date  . Diabetes mellitus without complication (HCC)   . Hypertension   . Type 2 diabetes mellitus (HCC)     There are no active problems to display for this patient.   History reviewed. No pertinent surgical history.      Home Medications    Prior to Admission medications   Medication Sig Start Date End Date Taking? Authorizing Provider  hydrochlorothiazide (HYDRODIURIL) 12.5 MG tablet Take 12.5 mg by mouth daily.   Yes [provider]  sulfamethoxazole-trimethoprim (BACTRIM DS,SEPTRA DS) 800-160 MG tablet Take 1 tablet by mouth 2 (two) times daily for 7 days. 07/12/18 07/19/18  Wynetta Fines, MD    Family History No family history on file.  Social History Social History   Tobacco Use  . Smoking status: Current Some Day Smoker    Types: Cigarettes  . Smokeless tobacco: Never Used  Substance  Use Topics  . Alcohol use: Yes  . Drug use: No     Allergies   Shellfish allergy   Review of Systems Review of Systems  All other systems reviewed and are negative.    Physical Exam Updated Vital Signs BP (!) 148/102 (BP Location: Left Arm)   Pulse 83   Temp 98.6 F (37 C) (Oral)   Resp 16   SpO2 100%   Physical Exam  Constitutional: He is oriented to person, place, and time. He appears well-developed and well-nourished. No distress.  HENT:  Head: Normocephalic and atraumatic.  Mouth/Throat: Oropharynx is clear and moist.  Small area of tenderness to the right anterior cheek  --approximately 3 x 5 mm.  No fluctuance.  Mild tenderness with palpation.  No erythema or streaking.  No intraoral lesions.  No dental abscess.  No obvious dental pathology.  Eyes: Pupils are equal, round, and reactive to light. Conjunctivae and EOM are normal.  Neck: Normal range of motion. Neck supple.  Cardiovascular: Normal rate, regular rhythm and normal heart sounds.  Pulmonary/Chest: Effort normal and breath sounds normal. No respiratory distress.  Abdominal: Soft. He exhibits no distension. There is no tenderness.  Musculoskeletal: Normal range of motion. He exhibits no edema or deformity.  Neurological: He is alert and oriented to person, place, and time.  Skin: Skin is warm and dry.  Psychiatric: He has a normal mood and affect.  Nursing  note and vitals reviewed.    ED Treatments / Results  Labs (all labs ordered are listed, but only abnormal results are displayed) Labs Reviewed - No data to display  EKG None  Radiology No results found.  Procedures Procedures (including critical care time)  Medications Ordered in ED Medications  sulfamethoxazole-trimethoprim (BACTRIM DS,SEPTRA DS) 800-160 MG per tablet 1 tablet (has no administration in time range)     Initial Impression / Assessment and Plan / ED Course  I have reviewed the triage vital signs and the nursing  notes.  Pertinent labs & imaging results that were available during my care of the patient were reviewed by me and considered in my medical decision making (see chart for details).     MDM  Screen complete  Patient is presenting for evaluation of what is likely a very early abscess in the subcutaneous tissue of the right cheek.  Patient will be started on antibiotics.  Patient declines I&D attempt at this time.  To be honest, I am not sure that I would be able to obtain significant purulence at this time given the lack of fluctuance.  Patient understands need for close follow-up.  Strict precautions are given and understood.  Final Clinical Impressions(s) / ED Diagnoses   Final diagnoses:  Abscess    ED Discharge Orders         Ordered    sulfamethoxazole-trimethoprim (BACTRIM DS,SEPTRA DS) 800-160 MG tablet  2 times daily     07/12/18 1104           Wynetta Fines, MD 07/12/18 1110

## 2018-07-12 NOTE — Discharge Instructions (Signed)
Please return for any problem.  Follow-up with a regular care provider as instructed. 

## 2018-07-12 NOTE — ED Triage Notes (Signed)
Pt complaint of abscess to right mid cheek noted yesterday; denies dental complaint.

## 2018-10-26 ENCOUNTER — Other Ambulatory Visit: Payer: Self-pay

## 2018-10-26 ENCOUNTER — Emergency Department (HOSPITAL_COMMUNITY)
Admission: EM | Admit: 2018-10-26 | Discharge: 2018-10-26 | Disposition: A | Discharge status: Home / Self Care | Payer: BLUE CROSS/BLUE SHIELD | Attending: Emergency Medicine | Admitting: Emergency Medicine

## 2018-10-26 ENCOUNTER — Encounter (HOSPITAL_COMMUNITY): Payer: Self-pay

## 2018-10-26 DIAGNOSIS — E119 Type 2 diabetes mellitus without complications: Secondary | ICD-10-CM | POA: Insufficient documentation

## 2018-10-26 DIAGNOSIS — R197 Diarrhea, unspecified: Secondary | ICD-10-CM

## 2018-10-26 DIAGNOSIS — F17210 Nicotine dependence, cigarettes, uncomplicated: Secondary | ICD-10-CM | POA: Insufficient documentation

## 2018-10-26 DIAGNOSIS — I10 Essential (primary) hypertension: Secondary | ICD-10-CM | POA: Insufficient documentation

## 2018-10-26 DIAGNOSIS — E860 Dehydration: Secondary | ICD-10-CM

## 2018-10-26 DIAGNOSIS — R112 Nausea with vomiting, unspecified: Secondary | ICD-10-CM

## 2018-10-26 LAB — COMPREHENSIVE METABOLIC PANEL
ALBUMIN: 5.1 g/dL — AB (ref 3.5–5.0)
ALT: 36 U/L (ref 0–44)
AST: 40 U/L (ref 15–41)
Alkaline Phosphatase: 47 U/L (ref 38–126)
Anion gap: 10 (ref 5–15)
BILIRUBIN TOTAL: 0.9 mg/dL (ref 0.3–1.2)
BUN: 19 mg/dL (ref 6–20)
CHLORIDE: 106 mmol/L (ref 98–111)
CO2: 22 mmol/L (ref 22–32)
CREATININE: 1.25 mg/dL — AB (ref 0.61–1.24)
Calcium: 9.7 mg/dL (ref 8.9–10.3)
GFR calc Af Amer: >60 mL/min (ref >60)
GFR calc non Af Amer: >60 mL/min (ref >60)
GLUCOSE: 116 mg/dL — AB (ref 70–99)
POTASSIUM: 3.7 mmol/L (ref 3.5–5.1)
Sodium: 138 mmol/L (ref 135–145)
TOTAL PROTEIN: 8.8 g/dL — AB (ref 6.5–8.1)

## 2018-10-26 LAB — URINALYSIS, ROUTINE W REFLEX MICROSCOPIC
Bilirubin Urine: NEGATIVE
Glucose, UA: NEGATIVE mg/dL
Hgb urine dipstick: NEGATIVE
KETONES UR: 5 mg/dL — AB
Leukocytes, UA: NEGATIVE
Nitrite: NEGATIVE
PH: 5 (ref 5.0–8.0)
PROTEIN: 100 mg/dL — AB
Specific Gravity, Urine: 1.033 — ABNORMAL HIGH (ref 1.005–1.030)

## 2018-10-26 LAB — CBC
HCT: 48.8 % (ref 39.0–52.0)
HEMOGLOBIN: 15.9 g/dL (ref 13.0–17.0)
MCH: 30.3 pg (ref 26.0–34.0)
MCHC: 32.6 g/dL (ref 30.0–36.0)
MCV: 93 fL (ref 80.0–100.0)
PLATELETS: 230 10*3/uL (ref 150–400)
RBC: 5.25 MIL/uL (ref 4.22–5.81)
RDW: 12.8 % (ref 11.5–15.5)
WBC: 5.6 10*3/uL (ref 4.0–10.5)
nRBC: 0 % (ref 0.0–0.2)

## 2018-10-26 LAB — LIPASE, BLOOD: Lipase: 34 U/L (ref 11–51)

## 2018-10-26 MED ORDER — SODIUM CHLORIDE 0.9 % IV BOLUS
1000.0000 mL | Freq: Once | INTRAVENOUS | Status: AC
  Administered 2018-10-26: 1000 mL via INTRAVENOUS

## 2018-10-26 MED ORDER — DICYCLOMINE HCL 10 MG PO CAPS
20.0000 mg | ORAL_CAPSULE | Freq: Once | ORAL | Status: AC
  Administered 2018-10-26: 20 mg via ORAL
  Filled 2018-10-26: qty 2

## 2018-10-26 MED ORDER — SODIUM CHLORIDE 0.9 % IV BOLUS: 1000.0000 mL | Freq: Once | INTRAVENOUS | Status: DC

## 2018-10-26 MED ORDER — ONDANSETRON HCL 4 MG/2ML IJ SOLN
4.0000 mg | Freq: Once | INTRAMUSCULAR | Status: AC
  Administered 2018-10-26: 4 mg via INTRAVENOUS
  Filled 2018-10-26: qty 2

## 2018-10-26 MED ORDER — SODIUM CHLORIDE 0.9% FLUSH: 3.0000 mL | Freq: Once | INTRAVENOUS | Status: DC

## 2018-10-26 MED ORDER — ONDANSETRON 4 MG PO TBDP: 4.0000 mg | ORAL_TABLET | Freq: Three times a day (TID) | ORAL | 0 refills | Status: DC | PRN

## 2018-10-26 MED ORDER — DICYCLOMINE HCL 20 MG PO TABS: 20.0000 mg | ORAL_TABLET | Freq: Three times a day (TID) | ORAL | 0 refills | Status: AC

## 2018-10-26 MED ORDER — DIPHENOXYLATE-ATROPINE 2.5-0.025 MG PO TABS
2.0000 | ORAL_TABLET | Freq: Once | ORAL | Status: AC
  Administered 2018-10-26: 2 via ORAL
  Filled 2018-10-26: qty 2

## 2018-10-26 MED ORDER — DIPHENOXYLATE-ATROPINE 2.5-0.025 MG PO TABS: 1.0000 | ORAL_TABLET | Freq: Four times a day (QID) | ORAL | 0 refills | Status: AC | PRN

## 2018-10-26 NOTE — ED Notes (Signed)
Patient given water and crackers. 

## 2018-10-26 NOTE — ED Provider Notes (Addendum)
[facility]-EMERGENCY DEPT Provider Note   CSN: [id number] Arrival date & time: 10/26/18  0940     History   Chief Complaint Chief Complaint  Patient presents with  . Diarrhea  . Emesis    HPI Jaime Henson is a 33 y.o. male.  HPI  33 yo M here w/ DM, HTN here with fatigue, nausea, emesis, diarrhea. Pt was in her usual state of health until yesterday morning. He ate a frozen chicken meal the night before, went ot bed, then awoke approx 8 hours later with severe diarrhea and vomiting. He reports that his BM was "pure liquid," >10 times throughout the day along with nausea and non bloody, non bilious emesis. Pt has had persistent sx since then, which worsen every time pt tries to eat or drink. No fever, denies any abd pain though does have some cramping just prior to vomiting or diarrhea. No known sick contacts. Has not tried anything for this.  Past Medical History:  Diagnosis Date  . Diabetes mellitus without complication (HCC)   . Hypertension   . Type 2 diabetes mellitus (HCC)     There are no active problems to display for this patient.   History reviewed. No pertinent surgical history.      Home Medications    Prior to Admission medications   Medication Sig Start Date End Date Taking? Authorizing Provider  dicyclomine (BENTYL) 20 MG tablet Take 1 tablet (20 mg total) by mouth 4 (four) times daily -  before meals and at bedtime for 3 days. 10/26/18 10/29/18  Shaune Pollack, MD  diphenoxylate-atropine (LOMOTIL) 2.5-0.025 MG tablet Take 1 tablet by mouth 4 (four) times daily as needed for up to 3 days for diarrhea or loose stools. 10/26/18 10/29/18  Shaune Pollack, MD  ondansetron (ZOFRAN ODT) 4 MG disintegrating tablet Take 1 tablet (4 mg total) by mouth every 8 (eight) hours as needed for nausea or vomiting. 10/26/18   Shaune Pollack, MD    Family History History reviewed. No pertinent family history.  Social History Social History   Tobacco  Use  . Smoking status: Current Some Day Smoker    Types: Cigarettes  . Smokeless tobacco: Never Used  Substance Use Topics  . Alcohol use: Yes  . Drug use: No     Allergies   Shellfish allergy   Review of Systems Review of Systems  Constitutional: Positive for fatigue. Negative for chills and fever.  HENT: Negative for congestion and rhinorrhea.   Eyes: Negative for visual disturbance.  Respiratory: Negative for cough, shortness of breath and wheezing.   Cardiovascular: Negative for chest pain and leg swelling.  Gastrointestinal: Positive for diarrhea, nausea and vomiting. Negative for abdominal pain.  Genitourinary: Negative for dysuria and flank pain.  Musculoskeletal: Negative for neck pain and neck stiffness.  Skin: Negative for rash and wound.  Allergic/Immunologic: Negative for immunocompromised state.  Neurological: Positive for weakness. Negative for syncope and headaches.  All other systems reviewed and are negative.    Physical Exam Updated Vital Signs BP (!) 139/94   Pulse 78   Temp 98.3 F (36.8 C) (Oral)   Resp 17   Ht 5\' 10"  (1.778 m)   Wt 104.3 kg   SpO2 96%   BMI 33.00 kg/m   Physical Exam Vitals signs and nursing note reviewed.  Constitutional:      General: He is not in acute distress.    Appearance: He is well-developed.  HENT:     Head: Normocephalic  and atraumatic.     Mouth/Throat:     Mouth: Mucous membranes are dry.  Eyes:     Conjunctiva/sclera: Conjunctivae normal.  Neck:     Musculoskeletal: Neck supple.  Cardiovascular:     Rate and Rhythm: Normal rate and regular rhythm.     Heart sounds: Normal heart sounds. No murmur. No friction rub.  Pulmonary:     Effort: Pulmonary effort is normal. No respiratory distress.     Breath sounds: Normal breath sounds. No wheezing or rales.  Abdominal:     General: Abdomen is flat. Bowel sounds are increased. There is no distension.     Palpations: Abdomen is soft.     Tenderness: There is  no abdominal tenderness. There is no right CVA tenderness, left CVA tenderness, guarding or rebound. Negative signs include Murphy's sign and McBurney's sign.  Skin:    General: Skin is warm.     Capillary Refill: Capillary refill takes less than 2 seconds.  Neurological:     Mental Status: He is alert and oriented to person, place, and time.     Motor: No abnormal muscle tone.      ED Treatments / Results  Labs (all labs ordered are listed, but only abnormal results are displayed) Labs Reviewed  COMPREHENSIVE METABOLIC PANEL - Abnormal; Notable for the following components:      Result Value   Glucose, Bld 116 (*)    Creatinine, Ser 1.25 (*)    Total Protein 8.8 (*)    Albumin 5.1 (*)    All other components within normal limits  URINALYSIS, ROUTINE W REFLEX MICROSCOPIC - Abnormal; Notable for the following components:   Color, Urine AMBER (*)    APPearance HAZY (*)    Specific Gravity, Urine 1.033 (*)    Ketones, ur 5 (*)    Protein, ur 100 (*)    Bacteria, UA FEW (*)    All other components within normal limits  LIPASE, BLOOD  CBC    EKG None  Radiology No results found.  Procedures Procedures (including critical care time)  Medications Ordered in ED Medications  sodium chloride 0.9 % bolus 1,000 mL (1,000 mLs Intravenous New Bag/Given (Non-Interop) 10/26/18 1131)  sodium chloride 0.9 % bolus 1,000 mL (1,000 mLs Intravenous Bolus 10/26/18 1104)  ondansetron (ZOFRAN) injection 4 mg (4 mg Intravenous Given 10/26/18 1104)  diphenoxylate-atropine (LOMOTIL) 2.5-0.025 MG per tablet 2 tablet (2 tablets Oral Given 10/26/18 1104)  dicyclomine (BENTYL) capsule 20 mg (20 mg Oral Given 10/26/18 1104)     Initial Impression / Assessment and Plan / ED Course  I have reviewed the triage vital signs and the nursing notes.  Pertinent labs & imaging results that were available during my care of the patient were reviewed by me and considered in my medical decision making (see chart  for details).     33 yo M here with n/v/d. I suspect this is 2/2 food-borne illness, versus viral GI illness. He has no abd pain, no TTP on exam, normal WBC, and reassuring labs. Do not suspect cholecystitis, cholangitis, appendicitis, or other acute intra-abd pathology. Labs are otherwise very reassuring. He feels markedly improved w/ symptomatic control. Will discharge with supportive care, outpt follow-up.  Final Clinical Impressions(s) / ED Diagnoses   Final diagnoses:  Dehydration  Nausea vomiting and diarrhea    ED Discharge Orders         Ordered    diphenoxylate-atropine (LOMOTIL) 2.5-0.025 MG tablet  4 times daily PRN  10/26/18 1213    dicyclomine (BENTYL) 20 MG tablet  3 times daily before meals & bedtime     10/26/18 1213    ondansetron (ZOFRAN ODT) 4 MG disintegrating tablet  Every 8 hours PRN     10/26/18 1213           Shaune PollackIsaacs, Ileta Ofarrell, MD 10/26/18 1214    Shaune PollackIsaacs, Nykeem Citro, MD 10/26/18 1214

## 2018-10-26 NOTE — ED Notes (Signed)
EDMD at bedside. PIV started and labs drawn off of PIV start

## 2018-10-26 NOTE — ED Triage Notes (Signed)
Patient reports that he had abdominal pain yesterday, but none today. Today he has vomiting and diarrhea.

## 2018-10-31 ENCOUNTER — Encounter (HOSPITAL_COMMUNITY): Payer: Self-pay | Admitting: Emergency Medicine

## 2018-10-31 ENCOUNTER — Emergency Department (HOSPITAL_COMMUNITY)
Admission: EM | Admit: 2018-10-31 | Discharge: 2018-10-31 | Disposition: A | Discharge status: Home / Self Care | Payer: Self-pay | Attending: Emergency Medicine | Admitting: Emergency Medicine

## 2018-10-31 ENCOUNTER — Other Ambulatory Visit: Payer: Self-pay

## 2018-10-31 DIAGNOSIS — E119 Type 2 diabetes mellitus without complications: Secondary | ICD-10-CM | POA: Insufficient documentation

## 2018-10-31 DIAGNOSIS — F17210 Nicotine dependence, cigarettes, uncomplicated: Secondary | ICD-10-CM | POA: Insufficient documentation

## 2018-10-31 DIAGNOSIS — I10 Essential (primary) hypertension: Secondary | ICD-10-CM | POA: Insufficient documentation

## 2018-10-31 DIAGNOSIS — K644 Residual hemorrhoidal skin tags: Secondary | ICD-10-CM | POA: Insufficient documentation

## 2018-10-31 DIAGNOSIS — R197 Diarrhea, unspecified: Secondary | ICD-10-CM

## 2018-10-31 DIAGNOSIS — K649 Unspecified hemorrhoids: Secondary | ICD-10-CM

## 2018-10-31 DIAGNOSIS — R112 Nausea with vomiting, unspecified: Secondary | ICD-10-CM | POA: Insufficient documentation

## 2018-10-31 LAB — COMPREHENSIVE METABOLIC PANEL
ALK PHOS: 41 U/L (ref 38–126)
ALT: 35 U/L (ref 0–44)
AST: 46 U/L — ABNORMAL HIGH (ref 15–41)
Albumin: 4.1 g/dL (ref 3.5–5.0)
Anion gap: 10 (ref 5–15)
BILIRUBIN TOTAL: 0.3 mg/dL (ref 0.3–1.2)
BUN: 15 mg/dL (ref 6–20)
CALCIUM: 8.7 mg/dL — AB (ref 8.9–10.3)
CHLORIDE: 97 mmol/L — AB (ref 98–111)
CO2: 27 mmol/L (ref 22–32)
CREATININE: 1.1 mg/dL (ref 0.61–1.24)
Glucose, Bld: 101 mg/dL — ABNORMAL HIGH (ref 70–99)
Potassium: 3.2 mmol/L — ABNORMAL LOW (ref 3.5–5.1)
Sodium: 134 mmol/L — ABNORMAL LOW (ref 135–145)
Total Protein: 7.3 g/dL (ref 6.5–8.1)

## 2018-10-31 LAB — CBC
HEMATOCRIT: 43.5 % (ref 39.0–52.0)
Hemoglobin: 14.2 g/dL (ref 13.0–17.0)
MCH: 30 pg (ref 26.0–34.0)
MCHC: 32.6 g/dL (ref 30.0–36.0)
MCV: 91.8 fL (ref 80.0–100.0)
Platelets: 213 10*3/uL (ref 150–400)
RBC: 4.74 MIL/uL (ref 4.22–5.81)
RDW: 12.4 % (ref 11.5–15.5)
WBC: 7.4 10*3/uL (ref 4.0–10.5)
nRBC: 0 % (ref 0.0–0.2)

## 2018-10-31 LAB — LIPASE, BLOOD: LIPASE: 44 U/L (ref 11–51)

## 2018-10-31 MED ORDER — ONDANSETRON 4 MG PO TBDP: 4.0000 mg | ORAL_TABLET | Freq: Three times a day (TID) | ORAL | 0 refills | Status: AC | PRN

## 2018-10-31 MED ORDER — SODIUM CHLORIDE 0.9% FLUSH: 3.0000 mL | Freq: Once | INTRAVENOUS | Status: DC

## 2018-10-31 MED ORDER — LIDOCAINE HCL 2 % EX GEL: 1.0000 "application " | CUTANEOUS | 0 refills | Status: AC | PRN

## 2018-10-31 MED ORDER — HYDROCORTISONE 2.5 % RE CREA: TOPICAL_CREAM | RECTAL | 0 refills | Status: AC

## 2018-10-31 MED ORDER — LIDOCAINE HCL URETHRAL/MUCOSAL 2 % EX GEL
1.0000 "application " | Freq: Once | CUTANEOUS | Status: AC
  Administered 2018-10-31: 1
  Filled 2018-10-31: qty 5

## 2018-10-31 MED ORDER — HYDROCORTISONE ACETATE 25 MG RE SUPP
25.0000 mg | Freq: Once | RECTAL | Status: AC
  Administered 2018-10-31: 25 mg via RECTAL
  Filled 2018-10-31: qty 1

## 2018-10-31 NOTE — Discharge Instructions (Signed)
Take the prescribed medication as directed.  See print out for measures to help hemorrhoids like we talked about.  Can also try donut pillow, etc. Follow-up with your primary care doctor. Return to the ED for new or worsening symptoms.

## 2018-10-31 NOTE — ED Provider Notes (Signed)
[facility]-EMERGENCY DEPT Provider Note   CSN: [id number] Arrival date & time: 10/31/18  0135     History   Chief Complaint Chief Complaint  Patient presents with  . Emesis    HPI Jaime Henson is a 33 y.o. male.  The history is provided by the patient and medical records.  Emesis     33 year old male with history of hypertension and diabetes, presenting to the ED with nausea, vomiting, and rectal pain.  He was seen in the ED 5 days ago for nausea, vomiting, and diarrhea, got treatment here and was feeling much better.  States symptoms have started to subside but still has some intermittent episodes.  He last vomited yesterday.  Stools are loose but not necessarily diarrhea anymore.  He denies any blood in his emesis or stool.  States he mostly came in today because he has started having a lot of rectal pain, mostly when sitting upright, driving, etc.  States "I felt back there and it seems swollen".  States he thinks he may have a hemorrhoid but is not entirely sure.  He has never had issues like this before.  Tried using preparation H at home without relief.  Past Medical History:  Diagnosis Date  . Diabetes mellitus without complication (HCC)   . Hypertension   . Type 2 diabetes mellitus (HCC)     There are no active problems to display for this patient.   History reviewed. No pertinent surgical history.      Home Medications    Prior to Admission medications   Medication Sig Start Date End Date Taking? Authorizing Provider  dicyclomine (BENTYL) 20 MG tablet Take 1 tablet (20 mg total) by mouth 4 (four) times daily -  before meals and at bedtime for 3 days. 10/26/18 10/29/18  Shaune Pollack, MD  ondansetron (ZOFRAN ODT) 4 MG disintegrating tablet Take 1 tablet (4 mg total) by mouth every 8 (eight) hours as needed for nausea or vomiting. 10/26/18   Shaune Pollack, MD    Family History History reviewed. No pertinent family history.  Social  History Social History   Tobacco Use  . Smoking status: Current Some Day Smoker    Types: Cigarettes  . Smokeless tobacco: Never Used  Substance Use Topics  . Alcohol use: Yes  . Drug use: No     Allergies   Shellfish allergy   Review of Systems Review of Systems  Gastrointestinal: Positive for nausea, rectal pain and vomiting.  All other systems reviewed and are negative.    Physical Exam Updated Vital Signs BP (!) 157/97 (BP Location: Left Arm)   Pulse (!) 102   Temp 99.1 F (37.3 C) (Oral)   Resp 16   Ht 5\' 10"  (1.778 m)   Wt 106.1 kg   SpO2 97%   BMI 33.58 kg/m   Physical Exam Vitals signs and nursing note reviewed.  Constitutional:      Appearance: He is well-developed.     Comments: Drinking water and room without difficulty  HENT:     Head: Normocephalic and atraumatic.  Eyes:     Conjunctiva/sclera: Conjunctivae normal.     Pupils: Pupils are equal, round, and reactive to light.  Neck:     Musculoskeletal: Normal range of motion.  Cardiovascular:     Rate and Rhythm: Normal rate and regular rhythm.     Heart sounds: Normal heart sounds.  Pulmonary:     Effort: Pulmonary effort is normal.  Breath sounds: Normal breath sounds.  Abdominal:     General: Bowel sounds are normal.     Palpations: Abdomen is soft.     Comments: Soft, nontender, normal bowel sounds  Genitourinary:    Comments: Nonthrombosed, nonbleeding external hemorrhoids at the 12 and 6:00 positions, these are locally tender without complicating features Musculoskeletal: Normal range of motion.  Skin:    General: Skin is warm and dry.  Neurological:     Mental Status: He is alert and oriented to person, place, and time.      ED Treatments / Results  Labs (all labs ordered are listed, but only abnormal results are displayed) Labs Reviewed  COMPREHENSIVE METABOLIC PANEL - Abnormal; Notable for the following components:      Result Value   Sodium 134 (*)    Potassium 3.2  (*)    Chloride 97 (*)    Glucose, Bld 101 (*)    Calcium 8.7 (*)    AST 46 (*)    All other components within normal limits  LIPASE, BLOOD  CBC  URINALYSIS, ROUTINE W REFLEX MICROSCOPIC    EKG None  Radiology No results found.  Procedures Procedures (including critical care time)  Medications Ordered in ED Medications  sodium chloride flush (NS) 0.9 % injection 3 mL (has no administration in time range)  hydrocortisone (ANUSOL-HC) suppository 25 mg (has no administration in time range)  lidocaine (XYLOCAINE) 2 % jelly 1 application (has no administration in time range)     Initial Impression / Assessment and Plan / ED Course  I have reviewed the triage vital signs and the nursing notes.  Pertinent labs & imaging results that were available during my care of the patient were reviewed by me and considered in my medical decision making (see chart for details).  33 year old male here with nausea, vomiting, and diarrhea along with rectal pain.  He was seen for same on 10/26/2018, symptoms have improved but still some intermittent episodes.  Primarily came in today due to new rectal pain.  He is afebrile and nontoxic in appearance here.  Drinking water in room without issue.  His abdomen is soft and benign.  Rectal exam does reveal 2 small hemorrhoids at the 12 and 6:00 positions without any complicating features.  Has not had any blood in the stool.  Repeat labs today are overall reassuring.  Has tried Preparation H without much relief so we will start Anusol suppositories and give lidocaine jelly for symptomatic relief.  Discussed sitz bath's, doughnut pillow, avoiding straining with heavy lifting or BM's, and other supportive measures for home as well.  Close follow-up with PCP.  Can return here for any new/acute changes.  Final Clinical Impressions(s) / ED Diagnoses   Final diagnoses:  Nausea vomiting and diarrhea  Hemorrhoids, unspecified hemorrhoid type    ED Discharge  Orders         Ordered    hydrocortisone (ANUSOL-HC) 2.5 % rectal cream     10/31/18 0428    ondansetron (ZOFRAN ODT) 4 MG disintegrating tablet  Every 8 hours PRN     10/31/18 0428    lidocaine (XYLOCAINE) 2 % jelly  As needed     10/31/18 0428           Garlon Hatchet, PA-C 10/31/18 0430    [name] Lefevre, MD 10/31/18 [phone]

## 2018-10-31 NOTE — ED Triage Notes (Signed)
Pt reports having nausea and vomiting and rectal pain. Pt reports being seen on 10/26/2018 for same.

## 2018-10-31 NOTE — ED Notes (Signed)
Bed: [facility] Expected date:  Expected time:  Means of arrival:  Comments: 

## 2018-11-04 ENCOUNTER — Emergency Department (HOSPITAL_COMMUNITY)
Admission: EM | Admit: 2018-11-04 | Discharge: 2018-11-05 | Disposition: A | Discharge status: Home / Self Care | Payer: Self-pay | Attending: Emergency Medicine | Admitting: Emergency Medicine

## 2018-11-04 ENCOUNTER — Other Ambulatory Visit: Payer: Self-pay

## 2018-11-04 ENCOUNTER — Encounter (HOSPITAL_COMMUNITY): Payer: Self-pay | Admitting: Emergency Medicine

## 2018-11-04 ENCOUNTER — Emergency Department (HOSPITAL_COMMUNITY): Admission: EM | Admit: 2018-11-04 | Discharge: 2018-11-04 | Discharge status: Absent without leave | Payer: Self-pay

## 2018-11-04 DIAGNOSIS — E119 Type 2 diabetes mellitus without complications: Secondary | ICD-10-CM | POA: Insufficient documentation

## 2018-11-04 DIAGNOSIS — F17210 Nicotine dependence, cigarettes, uncomplicated: Secondary | ICD-10-CM | POA: Insufficient documentation

## 2018-11-04 DIAGNOSIS — I10 Essential (primary) hypertension: Secondary | ICD-10-CM | POA: Insufficient documentation

## 2018-11-04 DIAGNOSIS — K6289 Other specified diseases of anus and rectum: Secondary | ICD-10-CM | POA: Insufficient documentation

## 2018-11-04 NOTE — ED Triage Notes (Signed)
Pt presents with complaint of rectal pain with being unable to walk that has been ongoing since 10/26/2018 and was seen for same on 10/31/2018

## 2018-11-05 LAB — CBG MONITORING, ED: Glucose-Capillary: 91 mg/dL (ref 70–99)

## 2018-11-05 MED ORDER — DOXYCYCLINE HYCLATE 100 MG PO CAPS: 100.0000 mg | ORAL_CAPSULE | Freq: Two times a day (BID) | ORAL | 0 refills | Status: AC

## 2018-11-05 MED ORDER — PREDNISONE 20 MG PO TABS: ORAL_TABLET | ORAL | 0 refills | Status: AC

## 2018-11-05 MED ORDER — HYDROCORTISONE ACETATE 25 MG RE SUPP: 25.0000 mg | Freq: Two times a day (BID) | RECTAL | 0 refills | Status: DC

## 2018-11-05 MED ORDER — HYDROCORTISONE ACETATE 25 MG RE SUPP: 25.0000 mg | Freq: Two times a day (BID) | RECTAL | 0 refills | Status: AC

## 2018-11-05 NOTE — ED Provider Notes (Signed)
[facility]-EMERGENCY DEPT Provider Note   CSN: [id number] Arrival date & time: 11/04/18  2254  Time seen 12:47 AM   History   Chief Complaint Chief Complaint  Patient presents with  . Rectal Pain    HPI Jaime Henson is a 33 y.o. male.  HPI patient states he is having hemorrhoids that flared up January 20 that he states are getting worse.  He states he was using Tucks which helped, soaking in Epson salts and using LoraKane topical 6 times a day.  He states he is not eating because it hurts to have a bowel movement, he has been increasing his fiber supplements.  He states he is having trouble walking due to the pain.  He denies any fever or bleeding.  Patient was seen in the ED on January 23 and January 28 for same thing.  PCP Renaye Rakers, MD   Past Medical History:  Diagnosis Date  . Diabetes mellitus without complication (HCC)   . Hypertension   . Type 2 diabetes mellitus (HCC)     There are no active problems to display for this patient.   History reviewed. No pertinent surgical history.      Home Medications    Prior to Admission medications   Medication Sig Start Date End Date Taking? Authorizing Provider  hydrocortisone (ANUSOL-HC) 2.5 % rectal cream Apply rectally 2 times daily Patient taking differently: Place 1 application rectally 2 (two) times daily as needed for hemorrhoids.  10/31/18  Yes Garlon Hatchet, PA-C  lidocaine (XYLOCAINE) 2 % jelly Apply 1 application topically as needed. Patient taking differently: Apply 1 application topically as needed (pain).  10/31/18  Yes Garlon Hatchet, PA-C  ondansetron (ZOFRAN ODT) 4 MG disintegrating tablet Take 1 tablet (4 mg total) by mouth every 8 (eight) hours as needed for nausea. 10/31/18  Yes Garlon Hatchet, PA-C  dicyclomine (BENTYL) 20 MG tablet Take 1 tablet (20 mg total) by mouth 4 (four) times daily -  before meals and at bedtime for 3 days. 10/26/18 10/29/18  Shaune Pollack, MD    doxycycline (VIBRAMYCIN) 100 MG capsule Take 1 capsule (100 mg total) by mouth 2 (two) times daily. 11/05/18   Devoria Albe, MD  hydrocortisone (ANUSOL-HC) 25 MG suppository Place 1 suppository (25 mg total) rectally 2 (two) times daily. 11/05/18   Devoria Albe, MD  predniSONE (DELTASONE) 20 MG tablet Take 2 po BID x 3 d then 1 po QD x 3d 11/05/18   Devoria Albe, MD    Family History History reviewed. No pertinent family history.  Social History Social History   Tobacco Use  . Smoking status: Current Some Day Smoker    Types: Cigarettes  . Smokeless tobacco: Never Used  Substance Use Topics  . Alcohol use: Yes  . Drug use: No  Employed   Allergies   Shellfish allergy   Review of Systems Review of Systems  All other systems reviewed and are negative.    Physical Exam Updated Vital Signs BP (!) 157/91 (BP Location: Right Arm)   Pulse 97   Temp 99.2 F (37.3 C) (Oral)   Resp 16   Ht 5\' 10"  (1.778 m)   Wt 106.1 kg   SpO2 97%   BMI 33.58 kg/m   Vital signs normal    Physical Exam Vitals signs and nursing note reviewed. Exam conducted with a chaperone present.  Constitutional:      Appearance: Normal appearance.  HENT:     Head:  Normocephalic and atraumatic.     Right Ear: External ear normal.     Left Ear: External ear normal.     Nose: Nose normal.  Eyes:     Extraocular Movements: Extraocular movements intact.     Conjunctiva/sclera: Conjunctivae normal.  Neck:     Musculoskeletal: Normal range of motion.  Cardiovascular:     Rate and Rhythm: Normal rate.  Pulmonary:     Effort: Pulmonary effort is normal. No respiratory distress.  Genitourinary:    Comments: Chaperone was present and also visualized his rectal area.  There are no hemorrhoids visualized.  When I palpate the area around his rectum it is soft without induration or fullness in the soft tissue to suggest an abscess.  The skin is not red or inflamed.  There is no drainage seen, there is no bleeding  seen.  Patient almost kicked me several times just to spread his cheeks to look, rectal exam was not done. Musculoskeletal: Normal range of motion.  Skin:    General: Skin is warm and dry.     Findings: No erythema or lesion.  Neurological:     General: No focal deficit present.     Mental Status: He is alert and oriented to person, place, and time.     Cranial Nerves: No cranial nerve deficit.  Psychiatric:        Mood and Affect: Mood normal.        Behavior: Behavior normal.        Thought Content: Thought content normal.      ED Treatments / Results  Labs (all labs ordered are listed, but only abnormal results are displayed) Results for orders placed or performed during the hospital encounter of 11/04/18  CBG monitoring, ED  Result Value Ref Range   Glucose-Capillary 91 70 - 99 mg/dL   Laboratory interpretation all normal    EKG None  Radiology No results found.  Procedures Procedures (including critical care time)  Medications Ordered in ED Medications - No data to display   Initial Impression / Assessment and Plan / ED Course  I have reviewed the triage vital signs and the nursing notes.  Pertinent labs & imaging results that were available during my care of the patient were reviewed by me and considered in my medical decision making (see chart for details).    Patient reiterates multiple times that he has missed work for a week because he has been staying in bed due to the pain of his hemorrhoids.  His last ED visit they do document they saw two small external hemorrhoids.  Patient does not have fever, so I am doubtful that he is having a perirectal abscess.  He may just have some proctitis.  He was started on doxycycline.  I also am going to check his CBG because he has underlying diabetes, if his blood sugar seems well controlled I may also put him on a short course of low-dose steroids.  Patient needs to be referred to GI and/or surgery for further evaluation  of this rectal pain.   Final Clinical Impressions(s) / ED Diagnoses   Final diagnoses:  Rectal pain    ED Discharge Orders         Ordered    doxycycline (VIBRAMYCIN) 100 MG capsule  2 times daily     11/05/18 0122    predniSONE (DELTASONE) 20 MG tablet     11/05/18 0122    hydrocortisone (ANUSOL-HC) 25 MG suppository  2 times daily,  Status:  Discontinued     11/05/18 0131    hydrocortisone (ANUSOL-HC) 25 MG suppository  2 times daily     11/05/18 0133         Plan discharge  Devoria Albe, MD, Concha Pyo, MD 11/05/18 [phone]

## 2018-11-05 NOTE — Discharge Instructions (Addendum)
Continue your sitz baths.  Continue using the Anusol HC suppositories.  Take the antibiotics and the prednisone until gone.  While you are on the prednisone you need to monitor your blood sugar closely so that your blood sugar does not get too high.  You need to consider either seeing a general surgeon or a gastroenterologist about your continued rectal problems.  Return to the emergency department if you get a fever.

## 2018-11-05 NOTE — ED Notes (Signed)
ED Provider at bedside. 

## 2019-04-07 IMAGING — CR DG SHOULDER 2+V*R*
3 series · 3 of 3 positions shown · non-contrast
Comparison: None.

CLINICAL DATA: Pain after heavy lifting

EXAM:
RIGHT SHOULDER - 2+ VIEW

[w shoulder external right]
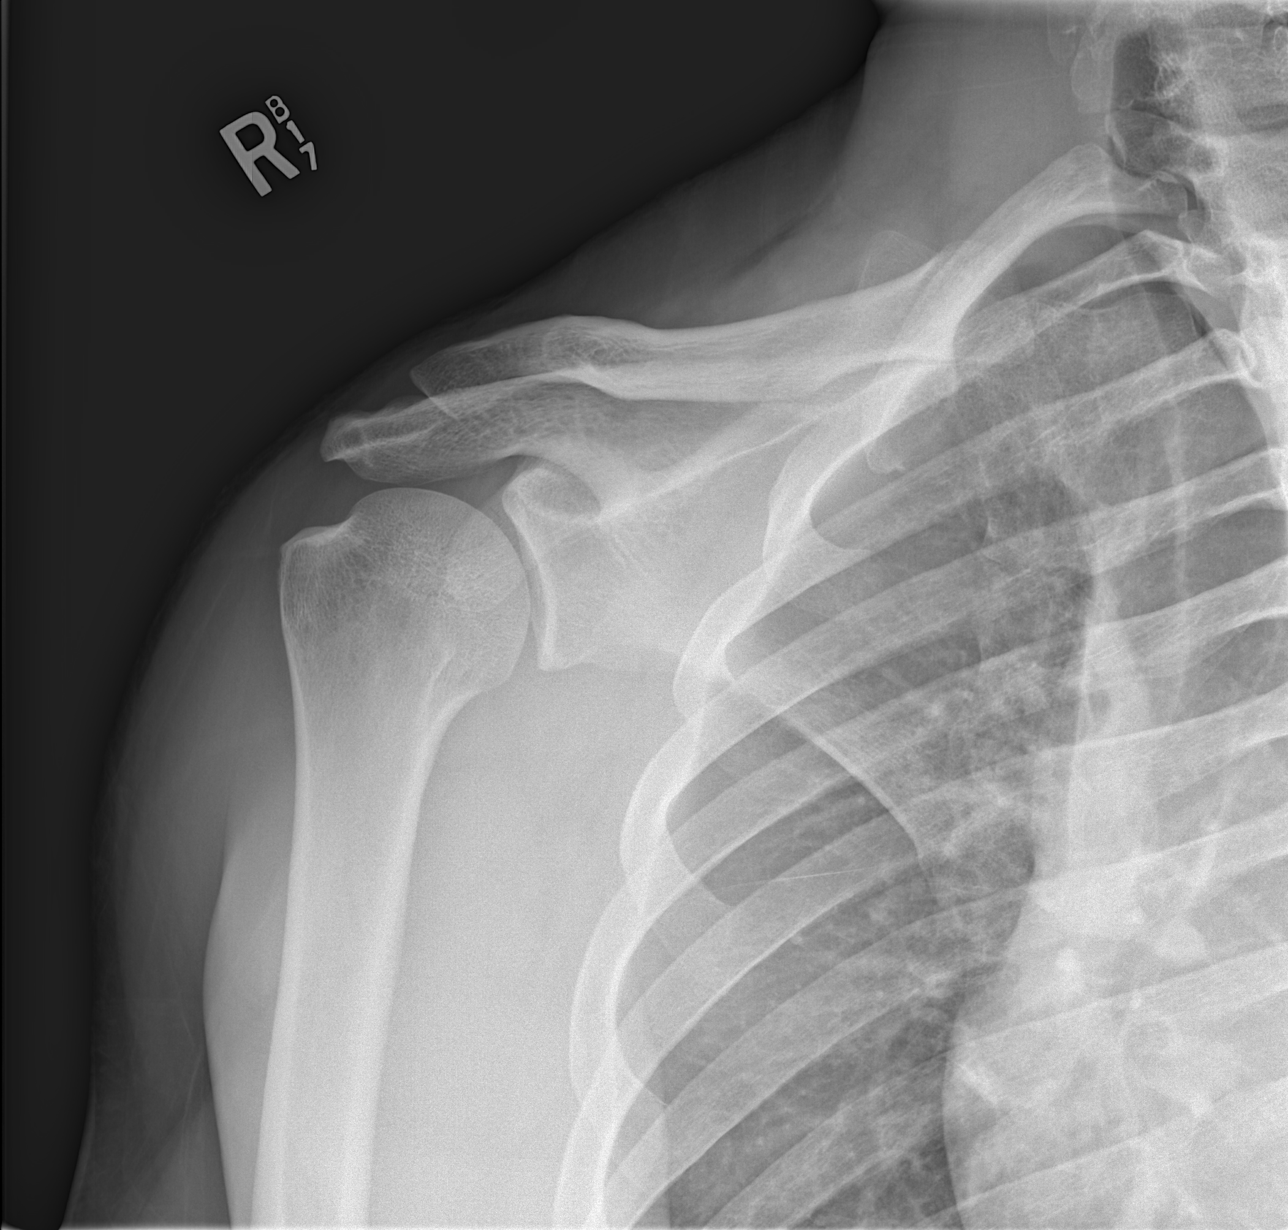

[w shoulder y-view right]
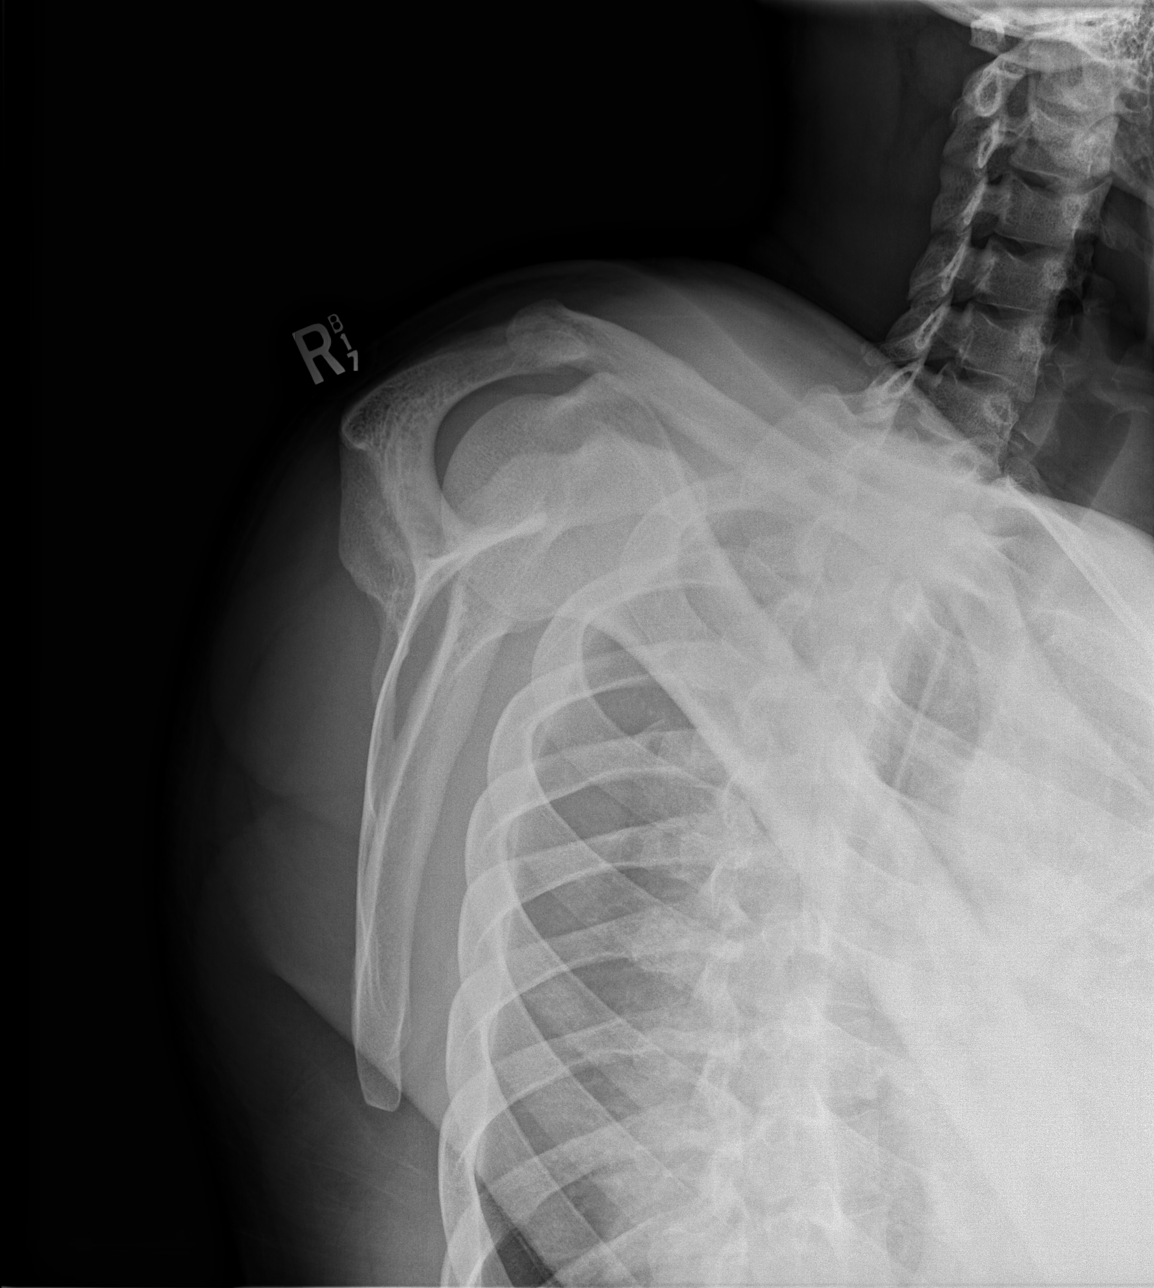

[x shoulder axillary right]
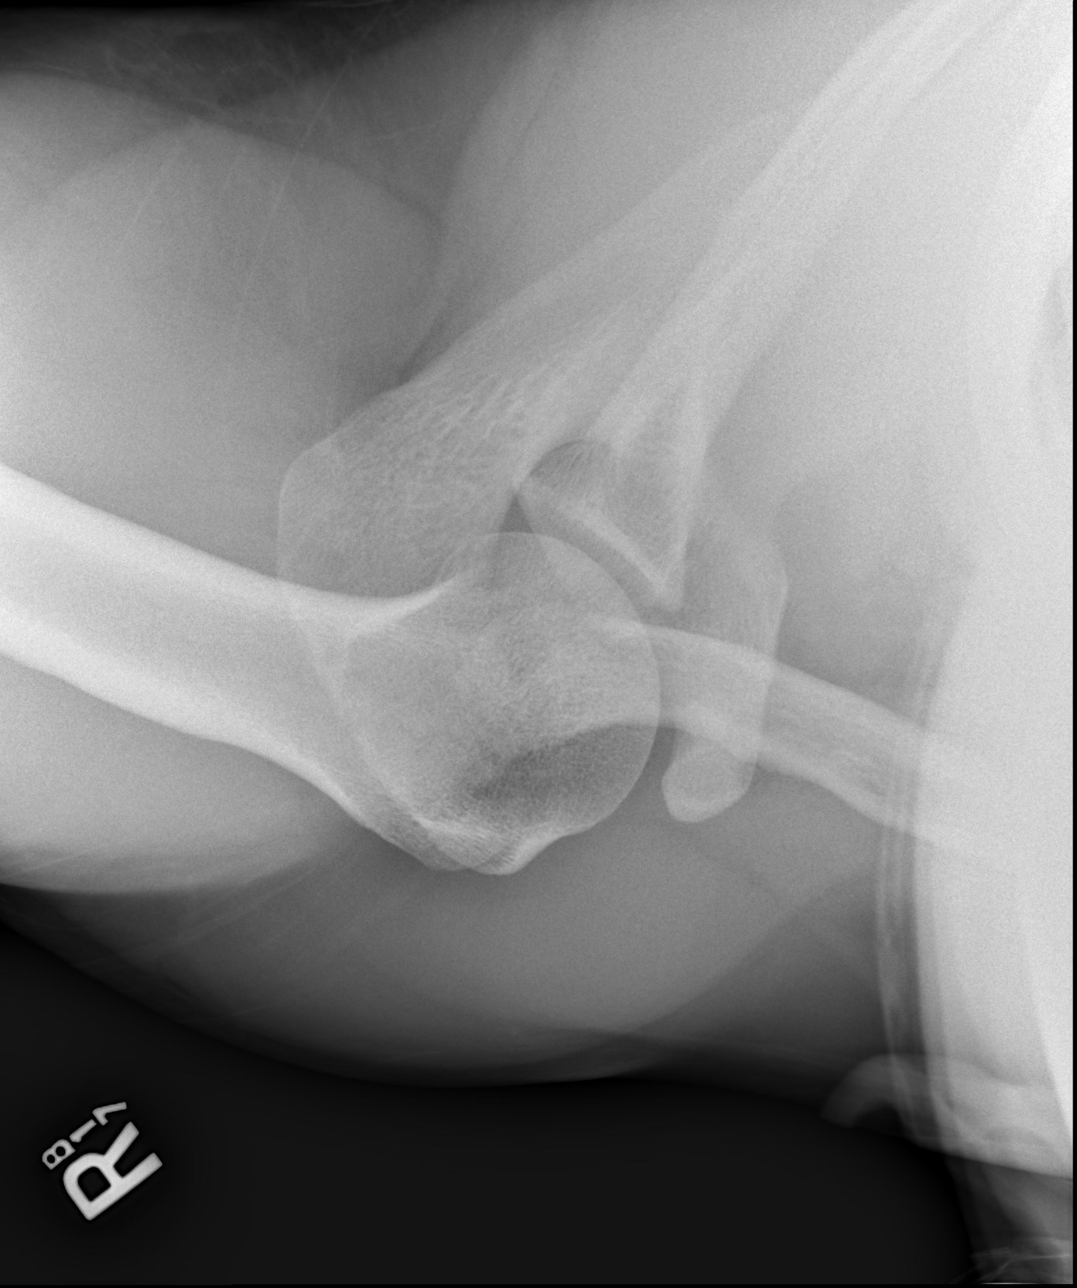

[3 of 3 positions shown; findings below may reference images not displayed]

FINDINGS: Oblique, Y scapular, and axillary images were obtained. No fracture
or dislocation. Joint spaces appear normal. No erosive change or
intra-articular calcification. Visualized right lung clear.
IMPRESSION: No fracture or dislocation.  No appreciable arthropathy.

## 2019-10-03 ENCOUNTER — Encounter (HOSPITAL_COMMUNITY): Payer: Self-pay

## 2019-10-03 ENCOUNTER — Emergency Department (HOSPITAL_COMMUNITY)
Admission: EM | Admit: 2019-10-03 | Discharge: 2019-10-03 | Disposition: A | Discharge status: Home / Self Care | Payer: Self-pay | Attending: Emergency Medicine | Admitting: Emergency Medicine

## 2019-10-03 ENCOUNTER — Other Ambulatory Visit: Payer: Self-pay

## 2019-10-03 DIAGNOSIS — H6690 Otitis media, unspecified, unspecified ear: Secondary | ICD-10-CM

## 2019-10-03 DIAGNOSIS — H6692 Otitis media, unspecified, left ear: Secondary | ICD-10-CM | POA: Insufficient documentation

## 2019-10-03 DIAGNOSIS — E119 Type 2 diabetes mellitus without complications: Secondary | ICD-10-CM | POA: Insufficient documentation

## 2019-10-03 DIAGNOSIS — H60502 Unspecified acute noninfective otitis externa, left ear: Secondary | ICD-10-CM | POA: Insufficient documentation

## 2019-10-03 DIAGNOSIS — F17210 Nicotine dependence, cigarettes, uncomplicated: Secondary | ICD-10-CM | POA: Insufficient documentation

## 2019-10-03 DIAGNOSIS — I10 Essential (primary) hypertension: Secondary | ICD-10-CM | POA: Insufficient documentation

## 2019-10-03 MED ORDER — OFLOXACIN 0.3 % OT SOLN: 10.0000 [drp] | Freq: Every day | OTIC | 0 refills | Status: AC

## 2019-10-03 MED ORDER — AMOXICILLIN 500 MG PO CAPS: 500.0000 mg | ORAL_CAPSULE | Freq: Three times a day (TID) | ORAL | 0 refills | Status: AC

## 2019-10-03 NOTE — Discharge Instructions (Signed)
You were evaluated in the Emergency Department and after careful evaluation, we did not find any emergent condition requiring admission or further testing in the hospital.  Your exam/testing today was overall reassuring.  Your symptoms seem to be due to an ear infection.  As discussed, we are going to treat you with both pills and drops.  We have placed a wick in your ear so that the medicine will reach the inside of the ear.  The wick may fall out of your ear.  If so, you can place the drops inside the ear hole.  Please return to the Emergency Department if you experience any worsening of your condition.  We encourage you to follow up with a primary care provider.  Thank you for allowing [place] to be a part of your care.

## 2019-10-03 NOTE — ED Triage Notes (Signed)
Pt reports left sided otalgia that started yesterday. Denies fever, cough, or congestion.

## 2019-10-03 NOTE — ED Provider Notes (Signed)
[facility] Emergency Department Provider Note MRN:  [id number]  Arrival date & time: 10/03/19     Chief Complaint   Otalgia   History of Present Illness   Jaime Henson is a 33 y.o. year-old male with a history of hypertension presenting to the ED with chief complaint of otalgia.  Left ear pain began yesterday, today started draining some blood and pus.  Denies fever, no cough, no nasal congestion, no other complaints.  Pain is mild to moderate in severity, worse with palpation.  Review of Systems  A complete 10 system review of systems was obtained and all systems are negative except as noted in the HPI and PMH.   Patient's Health History    Past Medical History:  Diagnosis Date  . Diabetes mellitus without complication ([place])   . Hypertension   . Type 2 diabetes mellitus ([place])     History reviewed. No pertinent surgical history.  History reviewed. No pertinent family history.  Social History   Socioeconomic History  . Marital status: Single    Spouse name: Not on file  . Number of children: Not on file  . Years of education: Not on file  . Highest education level: Not on file  Occupational History  . Not on file  Tobacco Use  . Smoking status: Current Some Day Smoker    Types: Cigarettes  . Smokeless tobacco: Never Used  Substance and Sexual Activity  . Alcohol use: Yes  . Drug use: No  . Sexual activity: Not on file  Other Topics Concern  . Not on file  Social History Narrative  . Not on file   Social Determinants of Health   Financial Resource Strain:   . Difficulty of Paying Living Expenses: Not on file  Food Insecurity:   . Worried About Charity fundraiser in the Last Year: Not on file  . Ran Out of Food in the Last Year: Not on file  Transportation Needs:   . Lack of Transportation (Medical): Not on file  . Lack of Transportation (Non-Medical): Not on file  Physical Activity:   . Days of Exercise per Week: Not on file    . Minutes of Exercise per Session: Not on file  Stress:   . Feeling of Stress : Not on file  Social Connections:   . Frequency of Communication with Friends and Family: Not on file  . Frequency of Social Gatherings with Friends and Family: Not on file  . Attends Religious Services: Not on file  . Active Member of Clubs or Organizations: Not on file  . Attends Archivist Meetings: Not on file  . Marital Status: Not on file  Intimate Partner Violence:   . Fear of Current or Ex-Partner: Not on file  . Emotionally Abused: Not on file  . Physically Abused: Not on file  . Sexually Abused: Not on file     Physical Exam  Vital Signs and Nursing Notes reviewed Vitals:   10/03/19 2151  BP: 140/89  Pulse: 84  Resp: 16  Temp: 98.3 F (36.8 C)  SpO2: 99%    CONSTITUTIONAL: Well-appearing, NAD NEURO:  Alert and oriented x 3, no focal deficits EYES:  eyes equal and reactive ENT/NECK:  no LAD, no JVD; preauricular swelling and tenderness, pain elicited with motion of the left pinna, no mastoid tenderness, purulent material exiting the left ear canal CARDIO: Regular rate, well-perfused, normal S1 and S2 PULM:  CTAB no wheezing or rhonchi GI/GU:  normal bowel sounds, non-distended, non-tender MSK/SPINE:  No gross deformities, no edema SKIN:  no rash, atraumatic PSYCH:  Appropriate speech and behavior  Diagnostic and Interventional Summary    EKG Interpretation  Date/Time:    Ventricular Rate:    PR Interval:    QRS Duration:   QT Interval:    QTC Calculation:   R Axis:     Text Interpretation:        Labs Reviewed - No data to display  No orders to display    Medications - No data to display   Procedures  /  Critical Care Procedures  ED Course and Medical Decision Making  I have reviewed the triage vital signs and the nursing notes.  Pertinent labs & imaging results that were available during my care of the patient were reviewed by me and considered in my  medical decision making (see below for details).     Exam most consistent with otitis externa, nothing to suggest malignant otitis externa, no mastoid tenderness, no fever, normal vital signs.  Will treat with ofloxacin drops, will also cover with amoxicillin given that we were unable to visualize the TM due to the swelling.    Elmer Sow. Pilar Plate, MD [facility] Emergency Medicine [facility] [email or web address]  Final Clinical Impressions(s) / ED Diagnoses     ICD-10-CM   1. Acute otitis externa of left ear, unspecified type  H60.502   2. Acute otitis media, unspecified otitis media type  H66.90     ED Discharge Orders         Ordered    ofloxacin (FLOXIN) 0.3 % OTIC solution  Daily     10/03/19 2255    amoxicillin (AMOXIL) 500 MG capsule  3 times daily     10/03/19 2255           Discharge Instructions Discussed with and Provided to Patient:     Discharge Instructions     You were evaluated in the Emergency Department and after careful evaluation, we did not find any emergent condition requiring admission or further testing in the hospital.  Your exam/testing today was overall reassuring.  Your symptoms seem to be due to an ear infection.  As discussed, we are going to treat you with both pills and drops.  We have placed a wick in your ear so that the medicine will reach the inside of the ear.  The wick may fall out of your ear.  If so, you can place the drops inside the ear hole.  Please return to the Emergency Department if you experience any worsening of your condition.  We encourage you to follow up with a primary care provider.  Thank you for allowing [place] to be a part of your care.       Sabas Sous, MD 10/03/19 2259

## 2020-07-31 ENCOUNTER — Emergency Department (HOSPITAL_COMMUNITY): Admission: EM | Admit: 2020-07-31 | Discharge: 2020-07-31 | Discharge status: Against Medical Advice | Payer: Self-pay

## 2020-07-31 NOTE — ED Notes (Signed)
Called [facility] for triage. Eloped prior to triage.
